# Patient Record
Sex: Female | Born: 1980
Health system: Southern US, Community
[De-identification: ages and names within clinical notes are randomized; demographics above are authoritative.]

## PROBLEM LIST (undated history)

## (undated) ENCOUNTER — Inpatient Hospital Stay (HOSPITAL_COMMUNITY): Payer: Self-pay

## (undated) DIAGNOSIS — F419 Anxiety disorder, unspecified: Secondary | ICD-10-CM

## (undated) DIAGNOSIS — J45909 Unspecified asthma, uncomplicated: Secondary | ICD-10-CM

## (undated) DIAGNOSIS — F329 Major depressive disorder, single episode, unspecified: Secondary | ICD-10-CM

## (undated) DIAGNOSIS — N390 Urinary tract infection, site not specified: Secondary | ICD-10-CM

## (undated) DIAGNOSIS — G35 Multiple sclerosis: Secondary | ICD-10-CM

## (undated) DIAGNOSIS — N3010 Interstitial cystitis (chronic) without hematuria: Secondary | ICD-10-CM

## (undated) DIAGNOSIS — D649 Anemia, unspecified: Secondary | ICD-10-CM

## (undated) DIAGNOSIS — O99013 Anemia complicating pregnancy, third trimester: Secondary | ICD-10-CM

## (undated) DIAGNOSIS — D509 Iron deficiency anemia, unspecified: Secondary | ICD-10-CM

## (undated) DIAGNOSIS — F32A Depression, unspecified: Secondary | ICD-10-CM

## (undated) HISTORY — DX: Interstitial cystitis (chronic) without hematuria: N30.10

## (undated) HISTORY — DX: Anxiety disorder, unspecified: F41.9

## (undated) HISTORY — DX: Anemia, unspecified: D64.9

## (undated) HISTORY — DX: Urinary tract infection, site not specified: N39.0

## (undated) HISTORY — DX: Depression, unspecified: F32.A

## (undated) HISTORY — DX: Major depressive disorder, single episode, unspecified: F32.9

## (undated) HISTORY — DX: Unspecified asthma, uncomplicated: J45.909

## (undated) HISTORY — PX: COLONOSCOPY: SHX174

## (undated) HISTORY — PX: OTHER SURGICAL HISTORY: SHX169

---
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
## 2002-04-25 ENCOUNTER — Other Ambulatory Visit: Admission: RE | Admit: 2002-04-25 | Discharge: 2002-04-25 | Payer: Self-pay | Admitting: Gynecology

## 2003-03-16 ENCOUNTER — Ambulatory Visit (HOSPITAL_BASED_OUTPATIENT_CLINIC_OR_DEPARTMENT_OTHER): Admission: RE | Admit: 2003-03-16 | Discharge: 2003-03-16 | Payer: Self-pay | Admitting: Urology

## 2003-03-16 ENCOUNTER — Ambulatory Visit (HOSPITAL_COMMUNITY): Admission: RE | Admit: 2003-03-16 | Discharge: 2003-03-16 | Payer: Self-pay | Admitting: Urology

## 2003-03-16 ENCOUNTER — Encounter (INDEPENDENT_AMBULATORY_CARE_PROVIDER_SITE_OTHER): Payer: Self-pay | Admitting: Specialist

## 2003-04-23 ENCOUNTER — Emergency Department (HOSPITAL_COMMUNITY): Admission: EM | Admit: 2003-04-23 | Discharge: 2003-04-23 | Payer: Self-pay | Admitting: Emergency Medicine

## 2004-03-26 ENCOUNTER — Other Ambulatory Visit: Admission: RE | Admit: 2004-03-26 | Discharge: 2004-03-26 | Payer: Self-pay | Admitting: Gynecology

## 2004-07-28 ENCOUNTER — Other Ambulatory Visit: Admission: RE | Admit: 2004-07-28 | Discharge: 2004-07-28 | Payer: Self-pay | Admitting: Gynecology

## 2005-04-27 ENCOUNTER — Other Ambulatory Visit: Admission: RE | Admit: 2005-04-27 | Discharge: 2005-04-27 | Payer: Self-pay | Admitting: Gynecology

## 2006-05-19 ENCOUNTER — Other Ambulatory Visit: Admission: RE | Admit: 2006-05-19 | Discharge: 2006-05-19 | Payer: Self-pay | Admitting: Gynecology

## 2008-08-16 ENCOUNTER — Other Ambulatory Visit: Admission: RE | Admit: 2008-08-16 | Discharge: 2008-08-16 | Payer: Self-pay | Admitting: Family Medicine

## 2010-01-02 ENCOUNTER — Other Ambulatory Visit: Admission: RE | Admit: 2010-01-02 | Discharge: 2010-01-02 | Payer: Self-pay | Admitting: Family Medicine

## 2010-07-04 NOTE — Op Note (Signed)
[name]  Patient:    Crystal Wilkerson, Crystal Wilkerson                       MRN: [id number] Proc. Date: 01/23/00 Adm. Date:  [id number] Attending:  Loura Halt Ii                           Operative Report  PREOPERATIVE DIAGNOSIS:  A 1.5 cm presternal keloid.  POSTOPERATIVE DIAGNOSIS:  A 1.5 cm presternal keloid.  PROCEDURE:  Excision of keloid with primary closure.  SURGEON:  Alfredia Ferguson, M.D.  ANESTHESIA:  2% Xylocaine and 1:100,000 epinephrine.  INDICATION FOR SURGERY:  This is a 30 year old female with a very prominent keloid in her mid-presternal area.  She has been treated with a number of modalities, including injection and laser, without improvement.  The patient comes in today for excision of the keloid.  She understands there is a high risk of recurrence of the keloid.  In spite of that, she wishes to proceed.  DESCRIPTION OF PROCEDURE:  Local anesthesia was infiltrated in the area of the keloid, and the area was prepped and draped in a sterile fashion.  After waiting approximately 10 minutes, an elliptical excision of the keloid was carried out down to the level of the subcutaneous tissue.  The keloid was passed off for pathology.  Wound edges were undermined for a distance of 3-4 mm.  Several drops of Kenalog 40 were placed in the wound.  They were allowed to just sit prior to closure.  The wound was closed by approximating the subdermal layer using interrupted 5-0 Vicryl suture.  The skin edges were united using a running 4-0 Prolene subcuticular.  Dermabond was applied, followed by Steri-Strips.  A light dressing was applied and the patient discharged to home in satisfactory condition. DD:  01/23/00 TD:  01/23/00 Job: [id number]

## 2010-07-04 NOTE — Op Note (Signed)
NAME:  Crystal Wilkerson, Crystal Wilkerson                          ACCOUNT NO.:  [id number]   MEDICAL RECORD NO.:  [id number]                   PATIENT TYPE:  AMB   LOCATION:  [facility]                                 FACILITY:  [facility]   PHYSICIAN:  Jamison Neighbor, M.D.               DATE OF BIRTH:  May 29, 1980   DATE OF PROCEDURE:  03/16/2003  DATE OF DISCHARGE:                                 OPERATIVE REPORT   PREOPERATIVE DIAGNOSIS:  Interstitial cystitis.   POSTOPERATIVE DIAGNOSIS:  Interstitial cystitis.   PROCEDURES:  1. Cystoscopy.  2. Urethral calibration.  3. Hydrodistention of the bladder.  4. Bladder biopsy.  5. Marcaine and Pyridium instillation.  6. Marcaine and Kenalog injection.   SURGEON:  Jamison Neighbor, M.D.   ANESTHESIA:  General.   COMPLICATIONS:  None.   DRAINS:  None.   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  The patient began to have lower urinary tract urgency and  frequency and was evaluated by a urologist in [place].  The patient  underwent potassium testing and was told she had interstitial cystitis.  The  patient's symptoms have not been quite as severe as is seen in most IC  patients, but she is bothered by them and she wanted to see if something  could be done to help her situation improve faster.  The patient was not  interested in instillation therapy but did feel that a diagnostic and  therapeutic hydrodistention might be worthwhile.  She is aware of the fact  that there is no guarantee this will cut down on her symptoms and that she  certainly will have a flare-up of her symptoms in the short term.  The  patient does understand, however, that there is a 60% chance that she will  feel significantly better within one to two weeks of the procedure.  If she  does respond in a positive fashion, however, there is no guarantee that this  will last long-term.  The patient gave full and informed consent.   After successful  induction of general anesthesia, the patient was placed in  the dorsal lithotomy position and prepped with Betadine and draped in the  usual sterile fashion.  Careful bimanual examination revealed no  abnormalities of the urethra.  There was no sign of a diverticulum.  She had  no cystocele, rectocele, or enterocele.  There were no masses on bimanual  exam, and the uterus was palpably normal.  The urethra was calibrated to 81  [place] with female urethral sounds with no evidence of stenosis or  stricture.  The cystoscope was inserted.  The bladder was carefully  inspected.  It was free of any tumor or stones.  Both ureteral orifices were  normal in configuration and location.  The bladder was distended at a  pressure of 100 cmH2O for five minutes.  When the bladder was drained,  the  patient was found to have a bladder capacity of just over 1200 mL, which is  essentially normal.  The patient did not have glomerulations.  A biopsy was  taken and the biopsy site was cauterized.  The biopsy was sent for mast cell  analysis.  The patient's bladder was drained and a mixture of Marcaine and  Pyridium was left in the bladder.  Marcaine and Kenalog were injected  periurethrally.  The patient tolerated the procedure well and was taken to  the recovery room in good condition.                                               Jamison Neighbor, M.D.    RJE/MEDQ  D:  03/16/2003  T:  03/16/2003  Job:  [id number]

## 2011-01-12 ENCOUNTER — Other Ambulatory Visit: Payer: Self-pay | Admitting: Family Medicine

## 2011-01-12 ENCOUNTER — Other Ambulatory Visit (HOSPITAL_COMMUNITY)
Admission: RE | Admit: 2011-01-12 | Discharge: 2011-01-12 | Disposition: A | Discharge status: Home / Self Care | Payer: BC Managed Care – PPO | Source: Ambulatory Visit | Attending: Family Medicine | Admitting: Family Medicine

## 2011-01-12 DIAGNOSIS — Z124 Encounter for screening for malignant neoplasm of cervix: Secondary | ICD-10-CM | POA: Insufficient documentation

## 2011-01-12 DIAGNOSIS — Z113 Encounter for screening for infections with a predominantly sexual mode of transmission: Secondary | ICD-10-CM | POA: Insufficient documentation

## 2011-07-24 ENCOUNTER — Encounter: Payer: Self-pay | Admitting: Gastroenterology

## 2011-08-31 ENCOUNTER — Other Ambulatory Visit (INDEPENDENT_AMBULATORY_CARE_PROVIDER_SITE_OTHER): Payer: BC Managed Care – PPO

## 2011-08-31 ENCOUNTER — Encounter: Payer: Self-pay | Admitting: Gastroenterology

## 2011-08-31 ENCOUNTER — Ambulatory Visit (INDEPENDENT_AMBULATORY_CARE_PROVIDER_SITE_OTHER): Payer: BC Managed Care – PPO | Admitting: Gastroenterology

## 2011-08-31 VITALS — BP 122/70 | HR 75 | Ht 70.0 in | Wt 145.2 lb

## 2011-08-31 DIAGNOSIS — R197 Diarrhea, unspecified: Secondary | ICD-10-CM

## 2011-08-31 DIAGNOSIS — R112 Nausea with vomiting, unspecified: Secondary | ICD-10-CM

## 2011-08-31 DIAGNOSIS — R109 Unspecified abdominal pain: Secondary | ICD-10-CM

## 2011-08-31 LAB — BASIC METABOLIC PANEL
BUN: 12 mg/dL (ref 6–23)
Calcium: 9.2 mg/dL (ref 8.4–10.5)
Chloride: 102 mEq/L (ref 96–112)
Creatinine, Ser: 0.9 mg/dL (ref 0.4–1.2)
GFR: 77.37 mL/min (ref >60.00)
Glucose, Bld: 85 mg/dL (ref 70–99)
Potassium: 3.5 mEq/L (ref 3.5–5.1)

## 2011-08-31 LAB — CBC WITH DIFFERENTIAL/PLATELET
Basophils Absolute: 0 10*3/uL (ref 0.0–0.1)
Basophils Relative: 0.4 % (ref 0.0–3.0)
Eosinophils Relative: 3 % (ref 0.0–5.0)
HCT: 36 % (ref 36.0–46.0)
Lymphocytes Relative: 30.7 % (ref 12.0–46.0)
Lymphs Abs: 2.2 10*3/uL (ref 0.7–4.0)
MCV: 89.2 fl (ref 78.0–100.0)
Monocytes Relative: 7.9 % (ref 3.0–12.0)
Neutro Abs: 4.2 10*3/uL (ref 1.4–7.7)
Neutrophils Relative %: 58 % (ref 43.0–77.0)
Platelets: 273 10*3/uL (ref 150.0–400.0)

## 2011-08-31 LAB — HEPATIC FUNCTION PANEL
ALT: 18 U/L (ref 0–35)
Bilirubin, Direct: 0.1 mg/dL (ref 0.0–0.3)
Total Bilirubin: 1.1 mg/dL (ref 0.3–1.2)
Total Protein: 7.3 g/dL (ref 6.0–8.3)

## 2011-08-31 MED ORDER — GLYCOPYRROLATE 1 MG PO TABS: 1.0000 mg | ORAL_TABLET | Freq: Two times a day (BID) | ORAL | Status: DC

## 2011-08-31 NOTE — Patient Instructions (Signed)
Your physician has requested that you go to the basement for the following lab work before leaving today: We have sent the following medications to your pharmacy for you to pick up at your convenience:Robinul. You have been given a Fobmap diet. Follow instructions on Hemoccult cards and mail them back to [place] when finished.

## 2011-08-31 NOTE — Progress Notes (Signed)
History of Present Illness: This is a 31 year old female who relates a history of frequent crampy abdominal pain associated with urgent watery nonbloody diarrhea since she was a child. She's tried eliminating various foods including milk products and processed foods with no change in symptoms. Sometimes her symptoms are worsened with stress. She has rare episodes of nausea and vomiting that she feels are related to stress. She states her primary physician obtain a celiac panel several months ago that was negative. She is not previously had evaluation by a gastroenterologist. She has a history of interstitial cystitis and has frequent urination for many years. She was evaluated by Dr. Logan Bores at age 10. Denies weight loss, constipation, change in stool caliber, melena, hematochezia, nausea, vomiting, dysphagia, reflux symptoms, chest pain.  Review of Systems: Pertinent positive and negative review of systems were noted in the above HPI section. All other review of systems were otherwise negative.  Current Medications, Allergies, Past Medical History, Past Surgical History, Family History and Social History were reviewed in [employer] record.  Physical Exam: General: Well developed , well nourished, no acute distress Head: Normocephalic and atraumatic Eyes:  sclerae anicteric, EOMI Ears: Normal auditory acuity Mouth: No deformity or lesions Neck: Supple, no masses or thyromegaly Lungs: Clear throughout to auscultation Heart: Regular rate and rhythm; no murmurs, rubs or bruits Abdomen: Soft, non tender and non distended. No masses, hepatosplenomegaly or hernias noted. Normal Bowel sounds Musculoskeletal: Symmetrical with no gross deformities  Skin: No lesions on visible extremities Pulses:  Normal pulses noted Extremities: No clubbing, cyanosis, edema or deformities noted Neurological: Alert oriented x 4, grossly nonfocal Cervical Nodes:  No significant cervical  adenopathy Inguinal Nodes: No significant inguinal adenopathy Psychological:  Alert and cooperative. Normal mood and affect  Assessment and Recommendations:  1. Urgent postprandial diarrhea and abdominal pain for 20 years. I suspect she has irritable bowel syndrome. Obtain standard stool studies, blood work and stool Hemoccults. Trial of FODMAP diet and glycopyrrolate twice a day. Request records of blood work from her primary physician including her celiac panel. Consider colonoscopy to exclude inflammatory bowel disease and microscopic colitis.  2. Episodic nausea and vomiting. Possibly IBS or stress induced. Observe response to treatment for irritable bowel syndrome.

## 2011-09-07 ENCOUNTER — Other Ambulatory Visit: Payer: BC Managed Care – PPO

## 2011-09-07 DIAGNOSIS — R109 Unspecified abdominal pain: Secondary | ICD-10-CM

## 2011-09-07 DIAGNOSIS — R197 Diarrhea, unspecified: Secondary | ICD-10-CM

## 2011-09-08 LAB — OVA AND PARASITE SCREEN: OP: NONE SEEN

## 2011-09-08 LAB — FECAL LACTOFERRIN, QUANT: Lactoferrin: NEGATIVE

## 2011-09-08 LAB — GIARDIA ANTIGEN: Giardia Screen (EIA): NEGATIVE

## 2011-09-11 ENCOUNTER — Telehealth: Payer: Self-pay | Admitting: *Deleted

## 2011-09-11 LAB — STOOL CULTURE

## 2011-09-11 NOTE — Telephone Encounter (Signed)
Informed pt the stool culture was negative, but the Pancreatic Elastase is still pending; sometimes it takes > 2 weeks for a result. Pt stated understanding and feels better with the medication, but has a dry mouth. She asked what to do about a f/u and I informed her Dr Russella Dar will instruct [place] when he gets all labs back. Pt stated understanding.

## 2011-09-29 ENCOUNTER — Telehealth: Payer: Self-pay | Admitting: Gastroenterology

## 2011-09-29 NOTE — Telephone Encounter (Signed)
Patient did not tolerate glycopyrolate well.  Made her dizzy and gave her dry mouth, so she stopped taking it.  She wants to know what the next step.  She was advised that you recommended colonoscopy, but she wants to make sure that is your current recommendation.  She is aware you are out of town.  Dr. Russella Dar please advise if it is ok to set up colon or office visit?

## 2011-10-01 NOTE — Telephone Encounter (Signed)
Direct colonoscopy Can try Levsin 1-2 ac and q4h prn

## 2011-10-02 MED ORDER — HYOSCYAMINE SULFATE 0.125 MG SL SUBL: 0.1250 mg | SUBLINGUAL_TABLET | SUBLINGUAL | Status: DC | PRN

## 2011-10-02 NOTE — Telephone Encounter (Signed)
Patient advised.  I have scheduled her for 11/03/11 2:00 colonoscopy and 10/27/11 for pre-visit.  She will call back for any questions

## 2011-10-27 ENCOUNTER — Ambulatory Visit (AMBULATORY_SURGERY_CENTER): Payer: BC Managed Care – PPO | Admitting: *Deleted

## 2011-10-27 ENCOUNTER — Encounter: Payer: Self-pay | Admitting: Gastroenterology

## 2011-10-27 ENCOUNTER — Telehealth: Payer: Self-pay | Admitting: *Deleted

## 2011-10-27 VITALS — Ht 70.0 in | Wt 147.0 lb

## 2011-10-27 DIAGNOSIS — Z1211 Encounter for screening for malignant neoplasm of colon: Secondary | ICD-10-CM

## 2011-10-27 DIAGNOSIS — R197 Diarrhea, unspecified: Secondary | ICD-10-CM

## 2011-10-27 DIAGNOSIS — R109 Unspecified abdominal pain: Secondary | ICD-10-CM

## 2011-10-27 MED ORDER — MOVIPREP 100 G PO SOLR: ORAL | Status: DC

## 2011-10-27 NOTE — Telephone Encounter (Signed)
Crystal Wilkerson verbalized disappointment in the communication between staff and her re: info on labs performed, their results, what the doctor is checking for and needing to know if her insurance will pay for the colonoscopy.  She states she feels like she's not important as a patient and just one of thousands.  I assured her this was not so and gave her info on getting help financially and Morrie Sheldon gave her the information re: her insurance.  I also pointed out the Grievance numbers should she want to go that route.  And that she can get a copy of her labs at the medical records dept.  I offered to have Darleen Crocker to come talk with her but she had to return to work and said she would call and leave a message for Aram Beecham to call her

## 2011-10-27 NOTE — Progress Notes (Signed)
Ms. Arduini verbalized disappointment in the communication between staff and her re: info on labs performed, their results, what the doctor is checking for and needing to know if her insurance will pay for the colonoscopy.  She states she feels like she's not important as a patient and just one of thousands.  I assured her this was not so and gave her info on getting help financially and Ashley gave her the information re: her insurance.  I also pointed out the Grievance numbers should she want to go that route.  And that she can get a copy of her labs at the medical records dept.  I offered to have Cynthia RN to come talk with her but she had to return to work and said she would call and leave a message for Cynthia to call her 

## 2011-10-28 ENCOUNTER — Telehealth: Payer: Self-pay | Admitting: Gastroenterology

## 2011-10-28 NOTE — Telephone Encounter (Signed)
Her insurance requires she meet her deductible then only covers her colonoscopy at 50%.  She can't afford to have a colonoscopy.  She would like answers to what is wrong with her.  She is asked to schedule an office visit to come back and discuss.  "I am not sure what I would be discussing".  She wants to know where the pain is coming from and not just take medicine.  She declines an appt and then hung up.

## 2011-10-28 NOTE — Telephone Encounter (Signed)
I have left a detailed message for the patient

## 2011-10-28 NOTE — Telephone Encounter (Signed)
Colonoscopy is the next step in her evaluation. I think she has IBS but need to exclude IBD and other types of colitis. Continue hyoscyamine 1-2 q4h as needed.

## 2011-11-03 ENCOUNTER — Encounter: Payer: BC Managed Care – PPO | Admitting: Gastroenterology

## 2012-04-04 ENCOUNTER — Other Ambulatory Visit (HOSPITAL_COMMUNITY)
Admission: RE | Admit: 2012-04-04 | Discharge: 2012-04-04 | Disposition: A | Discharge status: Home / Self Care | Payer: BC Managed Care – PPO | Source: Ambulatory Visit | Attending: Family Medicine | Admitting: Family Medicine

## 2012-04-04 ENCOUNTER — Other Ambulatory Visit: Payer: Self-pay | Admitting: Family Medicine

## 2012-04-04 DIAGNOSIS — Z124 Encounter for screening for malignant neoplasm of cervix: Secondary | ICD-10-CM | POA: Insufficient documentation

## 2012-04-04 DIAGNOSIS — Z1151 Encounter for screening for human papillomavirus (HPV): Secondary | ICD-10-CM | POA: Insufficient documentation

## 2012-04-04 DIAGNOSIS — Z113 Encounter for screening for infections with a predominantly sexual mode of transmission: Secondary | ICD-10-CM | POA: Insufficient documentation

## 2013-05-01 ENCOUNTER — Other Ambulatory Visit (HOSPITAL_COMMUNITY)
Admission: RE | Admit: 2013-05-01 | Discharge: 2013-05-01 | Disposition: A | Discharge status: Home / Self Care | Payer: BC Managed Care – PPO | Source: Ambulatory Visit | Attending: Family Medicine | Admitting: Family Medicine

## 2013-05-01 ENCOUNTER — Other Ambulatory Visit: Payer: Self-pay | Admitting: Family Medicine

## 2013-05-01 DIAGNOSIS — Z113 Encounter for screening for infections with a predominantly sexual mode of transmission: Secondary | ICD-10-CM | POA: Insufficient documentation

## 2013-05-01 DIAGNOSIS — N760 Acute vaginitis: Secondary | ICD-10-CM | POA: Insufficient documentation

## 2013-05-01 DIAGNOSIS — Z124 Encounter for screening for malignant neoplasm of cervix: Secondary | ICD-10-CM | POA: Insufficient documentation

## 2014-01-01 ENCOUNTER — Emergency Department (HOSPITAL_COMMUNITY): Payer: BC Managed Care – PPO

## 2014-01-01 ENCOUNTER — Encounter (HOSPITAL_COMMUNITY): Payer: Self-pay | Admitting: Family Medicine

## 2014-01-01 ENCOUNTER — Emergency Department (HOSPITAL_COMMUNITY)
Admission: EM | Admit: 2014-01-01 | Discharge: 2014-01-01 | Disposition: A | Discharge status: Home / Self Care | Payer: BC Managed Care – PPO | Attending: Emergency Medicine | Admitting: Emergency Medicine

## 2014-01-01 DIAGNOSIS — Y9289 Other specified places as the place of occurrence of the external cause: Secondary | ICD-10-CM | POA: Diagnosis not present

## 2014-01-01 DIAGNOSIS — J45909 Unspecified asthma, uncomplicated: Secondary | ICD-10-CM | POA: Insufficient documentation

## 2014-01-01 DIAGNOSIS — Z862 Personal history of diseases of the blood and blood-forming organs and certain disorders involving the immune mechanism: Secondary | ICD-10-CM | POA: Insufficient documentation

## 2014-01-01 DIAGNOSIS — Z23 Encounter for immunization: Secondary | ICD-10-CM | POA: Diagnosis not present

## 2014-01-01 DIAGNOSIS — Z8659 Personal history of other mental and behavioral disorders: Secondary | ICD-10-CM | POA: Insufficient documentation

## 2014-01-01 DIAGNOSIS — Y9389 Activity, other specified: Secondary | ICD-10-CM | POA: Diagnosis not present

## 2014-01-01 DIAGNOSIS — S61216A Laceration without foreign body of right little finger without damage to nail, initial encounter: Secondary | ICD-10-CM | POA: Insufficient documentation

## 2014-01-01 DIAGNOSIS — R52 Pain, unspecified: Secondary | ICD-10-CM

## 2014-01-01 DIAGNOSIS — S51831A Puncture wound without foreign body of right forearm, initial encounter: Secondary | ICD-10-CM | POA: Insufficient documentation

## 2014-01-01 DIAGNOSIS — Z79899 Other long term (current) drug therapy: Secondary | ICD-10-CM | POA: Insufficient documentation

## 2014-01-01 DIAGNOSIS — S41151A Open bite of right upper arm, initial encounter: Secondary | ICD-10-CM

## 2014-01-01 DIAGNOSIS — W540XXA Bitten by dog, initial encounter: Secondary | ICD-10-CM | POA: Insufficient documentation

## 2014-01-01 DIAGNOSIS — Y998 Other external cause status: Secondary | ICD-10-CM | POA: Diagnosis not present

## 2014-01-01 DIAGNOSIS — Z793 Long term (current) use of hormonal contraceptives: Secondary | ICD-10-CM | POA: Insufficient documentation

## 2014-01-01 DIAGNOSIS — Z8742 Personal history of other diseases of the female genital tract: Secondary | ICD-10-CM | POA: Insufficient documentation

## 2014-01-01 DIAGNOSIS — Z87440 Personal history of urinary (tract) infections: Secondary | ICD-10-CM | POA: Insufficient documentation

## 2014-01-01 DIAGNOSIS — S61431A Puncture wound without foreign body of right hand, initial encounter: Secondary | ICD-10-CM | POA: Insufficient documentation

## 2014-01-01 MED ORDER — AMOXICILLIN-POT CLAVULANATE 875-125 MG PO TABS: 1.0000 | ORAL_TABLET | Freq: Two times a day (BID) | ORAL | Status: DC

## 2014-01-01 MED ORDER — TETANUS-DIPHTH-ACELL PERTUSSIS 5-2.5-18.5 LF-MCG/0.5 IM SUSP
0.5000 mL | Freq: Once | INTRAMUSCULAR | Status: AC
  Administered 2014-01-01: 0.5 mL via INTRAMUSCULAR
  Filled 2014-01-01: qty 0.5

## 2014-01-01 MED ORDER — HYDROCODONE-ACETAMINOPHEN 5-325 MG PO TABS: 2.0000 | ORAL_TABLET | ORAL | Status: DC | PRN

## 2014-01-01 NOTE — Discharge Instructions (Signed)
1. Medications: vicodin, augmentin, usual home medications 2. Treatment: rest, drink plenty of fluids, keep wounds clean with warm soap and water 3. Follow Up: Please followup with your primary doctor in 2 days for wound check; if you do not have a primary care doctor use the resource guide provided to find one; Please return to the ER for redness, fever, or purulent    Animal Bite An animal bite can result in a scratch on the skin, deep open cut, puncture of the skin, crush injury, or tearing away of the skin or a body part. Dogs are responsible for most animal bites. Children are bitten more often than adults. An animal bite can range from very mild to more serious. A small bite from your house pet is no cause for alarm. However, some animal bites can become infected or injure a bone or other tissue. You must seek medical care if:  The skin is broken and bleeding does not slow down or stop after 15 minutes.  The puncture is deep and difficult to clean (such as a cat bite).  Pain, warmth, redness, or pus develops around the wound.  The bite is from a stray animal or rodent. There may be a risk of rabies infection.  The bite is from a snake, raccoon, skunk, fox, coyote, or bat. There may be a risk of rabies infection.  The person bitten has a chronic illness such as diabetes, liver disease, or cancer, or the person takes medicine that lowers the immune system.  There is concern about the location and severity of the bite. It is important to clean and protect an animal bite wound right away to prevent infection. Follow these steps:  Clean the wound with plenty of water and soap.  Apply an antibiotic cream.  Apply gentle pressure over the wound with a clean towel or gauze to slow or stop bleeding.  Elevate the affected area above the heart to help stop any bleeding.  Seek medical care. Getting medical care within 8 hours of the animal bite leads to the best possible outcome. DIAGNOSIS    Your caregiver will most likely:  Take a detailed history of the animal and the bite injury.  Perform a wound exam.  Take your medical history. Blood tests or X-rays may be performed. Sometimes, infected bite wounds are cultured and sent to a lab to identify the infectious bacteria.  TREATMENT  Medical treatment will depend on the location and type of animal bite as well as the patient's medical history. Treatment may include:  Wound care, such as cleaning and flushing the wound with saline solution, bandaging, and elevating the affected area.  Antibiotics.  Tetanus immunization.  Rabies immunization.  Leaving the wound open to heal. This is often done with animal bites, due to the high risk of infection. However, in certain cases, wound closure with stitches, wound adhesive, skin adhesive strips, or staples may be used. Infected bites that are left untreated may require intravenous (IV) antibiotics and surgical treatment in the hospital. HOME CARE INSTRUCTIONS  Follow your caregiver's instructions for wound care.  Take all medicines as directed.  If your caregiver prescribes antibiotics, take them as directed. Finish them even if you start to feel better.  Follow up with your caregiver for further exams or immunizations as directed. You may need a tetanus shot if:  You cannot remember when you had your last tetanus shot.  You have never had a tetanus shot.  The injury broke your skin.  If you get a tetanus shot, your arm may swell, get red, and feel warm to the touch. This is common and not a problem. If you need a tetanus shot and you choose not to have one, there is a rare chance of getting tetanus. Sickness from tetanus can be serious. SEEK MEDICAL CARE IF:  You notice warmth, redness, soreness, swelling, pus discharge, or a bad smell coming from the wound.  You have a red line on the skin coming from the wound.  You have a fever, chills, or a general ill  feeling.  You have nausea or vomiting.  You have continued or worsening pain.  You have trouble moving the injured part.  You have other questions or concerns. MAKE SURE YOU:  Understand these instructions.  Will watch your condition.  Will get help right away if you are not doing well or get worse. Document Released: 10/21/2010 Document Revised: 04/27/2011 Document Reviewed: 10/21/2010 [facility] Patient Information 2015 [place], [place]. This information is not intended to replace advice given to you by your health care provider. Make sure you discuss any questions you have with your health care provider.
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
## 2014-01-01 NOTE — ED Provider Notes (Signed)
CSN: [id number]     Arrival date & time 01/01/14  1007 History  This chart was scribed for non-physician practitioner Dierdre Forth, PA-C, working with Linwood Dibbles, MD by Littie Deeds, ED Scribe. This patient was seen in room TR09C/TR09C and the patient's care was started at 11:43 AM.      Chief Complaint  Patient presents with  . Animal Bite     The history is provided by the patient. No language interpreter was used.   HPI Comments: Crystal Wilkerson is a 33 y.o. female who presents to the Emergency Department complaining of an dog bite on her right forearm and right hand that occurred this morning while trying to break up a fight between her 2 dogs. Patient has a numerous small lacerations and puncture wounds on her right forearm and hand; she notes having the largest amount of pain on her 3rd finger on her right hand without numbness. She states that her dogs are UTD on shots. NKDA. She denies having hx of DM, HIV and IVDA, and she has not had any steroids recently.  No aggravating or alleviating factors.  Pt washed the wounds at home PTA.  Pt denies fever, chills, numbness, weakness, N/V.    Past Medical History  Diagnosis Date  . Interstitial cystitis   . Anemia   . Anxiety   . Depression   . UTI (lower urinary tract infection)   . Asthma     mild on exertion   Past Surgical History  Procedure Laterality Date  . Bladder enlarged      2006   Family History  Problem Relation Age of Onset  . Breast cancer Paternal Grandmother   . Heart failure Paternal Grandmother   . Heart disease Maternal Grandfather     all grandparents  . Diabetes Maternal Grandmother    History  Substance Use Topics  . Smoking status: Never Smoker   . Smokeless tobacco: Never Used  . Alcohol Use: 0.6 oz/week    1 Cans of beer per week     Comment: occ.   OB History    No data available     Review of Systems  Constitutional: Negative for fever and chills.  Gastrointestinal: Negative for  nausea and vomiting.  Musculoskeletal: Positive for joint swelling and arthralgias. Negative for back pain, neck pain and neck stiffness.  Skin: Positive for wound.  Allergic/Immunologic: Negative for immunocompromised state.  Neurological: Negative for weakness and numbness.  Hematological: Does not bruise/bleed easily.  Psychiatric/Behavioral: The patient is not nervous/anxious.   All other systems reviewed and are negative.     Allergies  Review of patient's allergies indicates no known allergies.  Home Medications   Prior to Admission medications   Medication Sig Start Date End Date Taking? Authorizing Provider  amoxicillin-clavulanate (AUGMENTIN) 875-125 MG per tablet Take 1 tablet by mouth every 12 (twelve) hours. 01/01/14   Jaxie Racanelli, PA-C  HYDROcodone-acetaminophen (NORCO/VICODIN) 5-325 MG per tablet Take 2 tablets by mouth every 4 (four) hours as needed for moderate pain or severe pain. 01/01/14   Anasofia Micallef, PA-C  hyoscyamine (LEVSIN SL) 0.125 MG SL tablet Place 0.125 mg under the tongue every 4 (four) hours as needed. 10/02/11 11/26/11  Meryl Dare, MD  LYSINE ACETATE PO Take 1 tablet by mouth daily.    Historical Provider, MD  MOVIPREP 100 G SOLR Movi prep take as directed no substitution 10/27/11   Meryl Dare, MD  Norgestimate-Ethinyl Estradiol Triphasic (TRI-SPRINTEC) 0.18/0.215/0.25 MG-35 MCG tablet  Take 1 tablet by mouth daily.    Historical Provider, MD   BP 112/73 mmHg  Pulse 68  Temp(Src) 98.1 F (36.7 C)  Resp 15  Wt 155 lb (70.308 kg)  SpO2 97%  LMP 12/31/2013 Physical Exam  Constitutional: She is oriented to person, place, and time. She appears well-developed and well-nourished. No distress.  HENT:  Head: Normocephalic and atraumatic.  Eyes: Conjunctivae are normal. No scleral icterus.  Neck: Normal range of motion.  Cardiovascular: Normal rate, regular rhythm, normal heart sounds and intact distal pulses.   No murmur  heard. Capillary refill < 3 sec  Pulmonary/Chest: Effort normal and breath sounds normal. No respiratory distress.  Musculoskeletal: Normal range of motion. She exhibits no edema.  ROM: full ROM of all joints of the right upper extremity.  Neurological: She is alert and oriented to person, place, and time.  Sensation: intact to dull and sharp in all fingers of the right hand. Strength: 5/5 in the right upper extremity, including grip strength.  Skin: Skin is warm and dry. She is not diaphoretic.  Multiple puncture wounds to the right forearm. Puncture wound to the palm of hand to the base of right long finger, one at the dorsum of the hand along the right ring finger. Laceration to the lateral side of the right little finger.  Psychiatric: She has a normal mood and affect.  Nursing note and vitals reviewed.   ED Course  LACERATION REPAIR Date/Time: 01/01/2014 5:13 PM Performed by: Dierdre Forth Authorized by: Dierdre Forth Consent: Verbal consent obtained. Risks and benefits: risks, benefits and alternatives were discussed Consent given by: patient Patient understanding: patient states understanding of the procedure being performed Patient consent: the patient's understanding of the procedure matches consent given Procedure consent: procedure consent matches procedure scheduled Relevant documents: relevant documents present and verified Site marked: the operative site was marked Imaging studies: imaging studies available Required items: required blood products, implants, devices, and special equipment available Patient identity confirmed: verbally with patient and arm band Time out: Immediately prior to procedure a "time out" was called to verify the correct patient, procedure, equipment, support staff and site/side marked as required. Body area: upper extremity Location details: right lower arm Laceration length: 2 cm Foreign bodies: no foreign bodies Tendon  involvement: none Nerve involvement: none Vascular damage: no Patient sedated: no Irrigation solution: saline Irrigation method: syringe Amount of cleaning: extensive Skin closure: Steri-Strips Approximation: loose Dressing: 4x4 sterile gauze Patient tolerance: Patient tolerated the procedure well with no immediate complications  LACERATION REPAIR Date/Time: 01/01/2014 5:14 PM Performed by: Dierdre Forth Authorized by: Dierdre Forth Consent: Verbal consent obtained. Risks and benefits: risks, benefits and alternatives were discussed Consent given by: patient Patient understanding: patient states understanding of the procedure being performed Patient consent: the patient's understanding of the procedure matches consent given Procedure consent: procedure consent matches procedure scheduled Relevant documents: relevant documents present and verified Site marked: the operative site was marked Imaging studies: imaging studies available Required items: required blood products, implants, devices, and special equipment available Patient identity confirmed: verbally with patient Time out: Immediately prior to procedure a "time out" was called to verify the correct patient, procedure, equipment, support staff and site/side marked as required. Body area: upper extremity Location details: right lower arm Laceration length: 1.5 cm Tendon involvement: none Nerve involvement: none Vascular damage: no Patient sedated: no Irrigation solution: saline Irrigation method: syringe Amount of cleaning: extensive Skin closure: Steri-Strips Approximation: loose Dressing: 4x4 sterile gauze Patient tolerance:  Patient tolerated the procedure well with no immediate complications  LACERATION REPAIR Date/Time: 01/01/2014 5:15 PM Performed by: Dierdre Forth Authorized by: Dierdre Forth Consent: Verbal consent obtained. Risks and benefits: risks, benefits and alternatives  were discussed Consent given by: patient Patient understanding: patient states understanding of the procedure being performed Patient consent: the patient's understanding of the procedure matches consent given Procedure consent: procedure consent matches procedure scheduled Relevant documents: relevant documents present and verified Site marked: the operative site was marked Imaging studies: imaging studies available Required items: required blood products, implants, devices, and special equipment available Patient identity confirmed: verbally with patient and arm band Time out: Immediately prior to procedure a "time out" was called to verify the correct patient, procedure, equipment, support staff and site/side marked as required. Body area: upper extremity Location details: right small finger Laceration length: 3.2 cm Foreign bodies: no foreign bodies Tendon involvement: none Nerve involvement: none Vascular damage: no Patient sedated: no Preparation: Patient was prepped and draped in the usual sterile fashion. Irrigation solution: saline Irrigation method: syringe Amount of cleaning: extensive Skin closure: Steri-Strips Approximation: loose Dressing: 4x4 sterile gauze Patient tolerance: Patient tolerated the procedure well with no immediate complications    DIAGNOSTIC STUDIES: Oxygen Saturation is 100% on room air, normal by my interpretation.    COORDINATION OF CARE: 11:46 AM-Discussed treatment plan which includes abx and wound cleaning with pt at bedside and pt agreed to plan.    Labs Review Labs Reviewed - No data to display  Imaging Review Dg Forearm Right  01/01/2014   CLINICAL DATA:  Dog bite on right forearm.  EXAM: RIGHT FOREARM - 2 VIEW  COMPARISON:  None  FINDINGS: There is no evidence of fracture or other focal bone lesions. Gas identified within the volar soft tissues of the forearm.  IMPRESSION: 1. Soft tissue lacerations along the volar aspect of the  forearm. 2. No acute bone abnormality.  No foreign bodies identified.   Electronically Signed   By: Signa Kell M.D.   On: 01/01/2014 11:51   Dg Hand Complete Right  01/01/2014   CLINICAL DATA:  33 year old female status post dog bite with lacerations and pain. Initial encounter.  EXAM: RIGHT HAND - COMPLETE 3+ VIEW  COMPARISON:  None.  FINDINGS: There is a small volume of subcutaneous gas tracking between the second and third proximal phalanges and metacarpals (arrows). No radiopaque foreign body identified.  Bone mineralization is within normal limits. Distal radius and ulna appear intact. Carpal bone alignment within normal limits. Metacarpals and phalanges intact. Other joint spaces preserved.  IMPRESSION: Positive for subcutaneous gas between the second and third proximal phalanges and metacarpals. No acute fracture or dislocation identified about the right hand.   Electronically Signed   By: Augusto Gamble M.D.   On: 01/01/2014 11:52     EKG Interpretation None      MDM   Final diagnoses:  Pain  Dog bite of right arm, initial encounter   Crystal Wilkerson presents with multiple small lacerations and puncture wounds from a dog bite located on the right hand and forearm.   Pt wounds irrigated well with 18ga angiocath with sterile saline in addition to cleaning with a surgical scrub.  Wounds examined with visualization of the base and no foreign bodies seen.  Steri-Strips placed on the largest wounds to prevent gaping but with only loose approximation. No stitches placed due to significant risk for infection.  Pt Alert and oriented, NAD, nontoxic, nonseptic appearing.  Capillary refill  intact and pt without neurologic deficit. X-rays without acute fracture. X-ray of the right hand with gas within the soft tissue from laceration and puncture wound. Patient tetanus updated.  Rabies vaccine and immunoglobulin not given as pt knows the dogs and they are UTD. Pt declined pain control in the ED.  We'll  discharge home with pain medication, Augmentin and requests for close follow-up with PCP or back in the ER.   Patient is to have a wound check in 24-48 hours by her primary care physician. She's return to the emergency department for evidence of infection. Patient given follow-up with hand surgery as needed for potential of infection.  Discussed with patient my concerns about infection from a puncture wound, specifically from a dog bite.  We reviewed signs and symptoms of concerning infection several times. Patient tetanus updated.  I have personally reviewed patient's vitals, nursing note and any pertinent labs or imaging.  It has been determined that no acute conditions requiring further emergency intervention are present at this time. The patient/guardian have been advised of the diagnosis and plan. I reviewed all labs and imaging including any potential incidental findings. We have discussed signs and symptoms that warrant return to the ED and they are listed in the discharge instructions.    Vital signs are stable at discharge.   BP 112/73 mmHg  Pulse 68  Temp(Src) 98.1 F (36.7 C)  Resp 15  Wt 155 lb (70.308 kg)  SpO2 97%  LMP 12/31/2013  I personally performed the services described in this documentation, which was scribed in my presence. The recorded information has been reviewed and is accurate.     Dahlia ClientHannah Travez Stancil, PA-C 01/01/14 1724  Linwood DibblesJon Knapp, MD 01/02/14 [phone]

## 2015-02-17 NOTE — L&D Delivery Note (Addendum)
Delivery Note  First Stage: Labor onset: 1500 on 11/9 Augmentation : Pitocin Analgesia /Anesthesia intrapartum: water immersion x 15 mins then immediately to Epidural AROM at 2100  Second Stage: Complete dilation at 2100 Onset of pushing at 2300 FHR second stage 145  Delivery of a viable female at 7 by CNM in OA position Tight nuchal cord x 1 / unable to reduce over head / reduced after delivery of shoulders Cord double clamped after cessation of pulsation, cut by FOB - Nathan Cord blood sample collected   Third Stage: Placenta delivered via Tomasa Blase intact with 3 VC @ 0320 Placenta disposition: with patient for encapsualtion Uterine tone firm / bleeding minimal  Bilateral labia minora lacerations identified - hemostatic no repair needed at this time Est. Blood Loss (mL): 50  Complications: Marginal Cord Insertion (not previously dx'd), Nuchal Cord  Mom to postpartum.  Baby to Couplet care / Skin to Skin.  Newborn: Birth Weight: 7 lbs 8 oz  Apgar Scores: 7/9 Feeding planned: breast  Raelyn Mora, M  MSN, CNM 12/27/2015, 4:10 AM

## 2015-05-15 DIAGNOSIS — Z22330 Carrier of Group B streptococcus: Secondary | ICD-10-CM | POA: Diagnosis not present

## 2015-05-15 DIAGNOSIS — Z3402 Encounter for supervision of normal first pregnancy, second trimester: Secondary | ICD-10-CM | POA: Diagnosis not present

## 2015-05-15 DIAGNOSIS — Z3A20 20 weeks gestation of pregnancy: Secondary | ICD-10-CM | POA: Diagnosis not present

## 2015-05-15 DIAGNOSIS — N898 Other specified noninflammatory disorders of vagina: Secondary | ICD-10-CM | POA: Diagnosis not present

## 2015-05-20 DIAGNOSIS — N898 Other specified noninflammatory disorders of vagina: Secondary | ICD-10-CM | POA: Diagnosis not present

## 2015-05-21 DIAGNOSIS — G959 Disease of spinal cord, unspecified: Secondary | ICD-10-CM | POA: Diagnosis not present

## 2015-05-21 DIAGNOSIS — N319 Neuromuscular dysfunction of bladder, unspecified: Secondary | ICD-10-CM | POA: Diagnosis not present

## 2015-06-18 DIAGNOSIS — Z22330 Carrier of Group B streptococcus: Secondary | ICD-10-CM | POA: Diagnosis not present

## 2015-06-18 DIAGNOSIS — Z139 Encounter for screening, unspecified: Secondary | ICD-10-CM | POA: Diagnosis not present

## 2015-06-18 DIAGNOSIS — R5383 Other fatigue: Secondary | ICD-10-CM | POA: Diagnosis not present

## 2015-07-16 DIAGNOSIS — M47814 Spondylosis without myelopathy or radiculopathy, thoracic region: Secondary | ICD-10-CM | POA: Diagnosis not present

## 2015-07-16 DIAGNOSIS — M47812 Spondylosis without myelopathy or radiculopathy, cervical region: Secondary | ICD-10-CM | POA: Diagnosis not present

## 2015-07-16 DIAGNOSIS — R292 Abnormal reflex: Secondary | ICD-10-CM | POA: Diagnosis not present

## 2015-08-08 DIAGNOSIS — Z3402 Encounter for supervision of normal first pregnancy, second trimester: Secondary | ICD-10-CM | POA: Diagnosis not present

## 2015-08-08 DIAGNOSIS — Z22330 Carrier of Group B streptococcus: Secondary | ICD-10-CM | POA: Diagnosis not present

## 2015-08-08 DIAGNOSIS — Z36 Encounter for antenatal screening of mother: Secondary | ICD-10-CM | POA: Diagnosis not present

## 2015-08-08 DIAGNOSIS — Z3A20 20 weeks gestation of pregnancy: Secondary | ICD-10-CM | POA: Diagnosis not present

## 2015-08-23 ENCOUNTER — Encounter (HOSPITAL_COMMUNITY): Payer: Self-pay | Admitting: *Deleted

## 2015-08-23 ENCOUNTER — Inpatient Hospital Stay (HOSPITAL_COMMUNITY)
Admission: AD | Admit: 2015-08-23 | Discharge: 2015-08-23 | Disposition: A | Discharge status: Home / Self Care | Payer: BLUE CROSS/BLUE SHIELD | Source: Ambulatory Visit | Attending: Obstetrics & Gynecology | Admitting: Obstetrics & Gynecology

## 2015-08-23 DIAGNOSIS — D649 Anemia, unspecified: Secondary | ICD-10-CM | POA: Diagnosis not present

## 2015-08-23 DIAGNOSIS — O99342 Other mental disorders complicating pregnancy, second trimester: Secondary | ICD-10-CM | POA: Diagnosis not present

## 2015-08-23 DIAGNOSIS — O99512 Diseases of the respiratory system complicating pregnancy, second trimester: Secondary | ICD-10-CM | POA: Insufficient documentation

## 2015-08-23 DIAGNOSIS — F419 Anxiety disorder, unspecified: Secondary | ICD-10-CM | POA: Diagnosis not present

## 2015-08-23 DIAGNOSIS — Z3A22 22 weeks gestation of pregnancy: Secondary | ICD-10-CM | POA: Diagnosis not present

## 2015-08-23 DIAGNOSIS — O99012 Anemia complicating pregnancy, second trimester: Secondary | ICD-10-CM | POA: Insufficient documentation

## 2015-08-23 DIAGNOSIS — R0602 Shortness of breath: Secondary | ICD-10-CM | POA: Insufficient documentation

## 2015-08-23 LAB — CBC
HEMATOCRIT: 28.7 % — AB (ref 36.0–46.0)
HEMOGLOBIN: 9.9 g/dL — AB (ref 12.0–15.0)
MCH: 30.7 pg (ref 26.0–34.0)
MCHC: 34.5 g/dL (ref 30.0–36.0)
MCV: 88.9 fL (ref 78.0–100.0)
Platelets: 262 10*3/uL (ref 150–400)
RBC: 3.23 MIL/uL — ABNORMAL LOW (ref 3.87–5.11)
RDW: 13.7 % (ref 11.5–15.5)
WBC: 9.7 10*3/uL (ref 4.0–10.5)

## 2015-08-23 LAB — COMPREHENSIVE METABOLIC PANEL
ALK PHOS: 43 U/L (ref 38–126)
ALT: 19 U/L (ref 14–54)
AST: 19 U/L (ref 15–41)
Albumin: 3.4 g/dL — ABNORMAL LOW (ref 3.5–5.0)
Anion gap: 6 (ref 5–15)
BILIRUBIN TOTAL: 0.8 mg/dL (ref 0.3–1.2)
BUN: 7 mg/dL (ref 6–20)
CHLORIDE: 104 mmol/L (ref 101–111)
CO2: 22 mmol/L (ref 22–32)
Calcium: 8.7 mg/dL — ABNORMAL LOW (ref 8.9–10.3)
Creatinine, Ser: 0.54 mg/dL (ref 0.44–1.00)
GFR calc Af Amer: >60 mL/min (ref >60)
GFR calc non Af Amer: >60 mL/min (ref >60)
Glucose, Bld: 106 mg/dL — ABNORMAL HIGH (ref 65–99)
POTASSIUM: 3.4 mmol/L — AB (ref 3.5–5.1)
Sodium: 132 mmol/L — ABNORMAL LOW (ref 135–145)
TOTAL PROTEIN: 6.6 g/dL (ref 6.5–8.1)

## 2015-08-23 LAB — TSH: TSH: 1.698 u[IU]/mL (ref 0.350–4.500)

## 2015-08-23 MED ORDER — HYDROXYZINE PAMOATE 25 MG PO CAPS: 25.0000 mg | ORAL_CAPSULE | Freq: Three times a day (TID) | ORAL | Status: DC | PRN

## 2015-08-23 MED ORDER — ALBUTEROL SULFATE (2.5 MG/3ML) 0.083% IN NEBU
2.5000 mg | INHALATION_SOLUTION | Freq: Once | RESPIRATORY_TRACT | Status: AC
  Administered 2015-08-23: 2.5 mg via RESPIRATORY_TRACT
  Filled 2015-08-23: qty 3

## 2015-08-23 MED ORDER — FERROUS SULFATE 325 (65 FE) MG PO TABS: 325.0000 mg | ORAL_TABLET | Freq: Every day | ORAL | Status: DC

## 2015-08-23 MED ORDER — HYDROXYZINE HCL 50 MG/ML IM SOLN
25.0000 mg | Freq: Once | INTRAMUSCULAR | Status: AC
  Administered 2015-08-23: 25 mg via INTRAMUSCULAR
  Filled 2015-08-23: qty 0.5

## 2015-08-23 NOTE — MAU Note (Signed)
Patient presents to mau with c/o shortness of breath for the last few days. Sats 100% ra; HR 74.

## 2015-08-23 NOTE — MAU Provider Note (Signed)
History     CSN: [id number]  Arrival date and time: 08/23/15 1322   First Provider Initiated Contact with Patient 08/23/15 1355      Chief Complaint  Patient presents with  . Shortness of Breath   HPI   [name] is a 35 y.o. female G1P0 @ 31w5dpresenting with SOB that started last night around 5:30; when she got into bed around 8:30 pm the SOB was really bad. She had a hard time getting comfortable last night; she did wake up from the symptoms throughout the night, however was able to get some sleep. She got to work today and felt that the shortness of breath was still there and she feels that it is lasting longer. Nothing makes the symptoms  Better or worse. She had the same symptoms a few weeks ago, but they were short lived and went away quickly. She has never felt these symptoms outside of pregnancy. She has a lot going on in her life currently and is unsure whether the symptoms are related to stress.   The symptoms are making her anxious.   OB History    Gravida Para Term Preterm AB TAB SAB Ectopic Multiple Living   1               Past Medical History  Diagnosis Date  . Interstitial cystitis   . Anemia   . Anxiety   . Depression   . UTI (lower urinary tract infection)   . Asthma     mild on exertion    Past Surgical History  Procedure Laterality Date  . Bladder enlarged      2006    Family History  Problem Relation Age of Onset  . Breast cancer Paternal Grandmother   . Heart failure Paternal Grandmother   . Heart disease Maternal Grandfather     all grandparents  . Diabetes Maternal Grandmother     Social History  Substance Use Topics  . Smoking status: Never Smoker   . Smokeless tobacco: Never Used  . Alcohol Use: 0.6 oz/week    1 Cans of beer per week     Comment: occ.    Allergies: No Known Allergies  Prescriptions prior to admission  Medication Sig Dispense Refill Last Dose  . cyanocobalamin 100 MCG tablet Take 100 mcg by mouth  daily.   08/23/2015 at Unknown time  . folic acid (FOLVITE) 4268MCG tablet Take 400 mcg by mouth daily.   08/23/2015 at Unknown time  . LYSINE ACETATE PO Take 1 tablet by mouth daily.   08/23/2015 at Unknown time  . OVER THE COUNTER MEDICATION Take 2 tablets by mouth 3 (three) times daily. Pt takes a medication called Wholezymes. (For digestion)   08/21/2015  . Prenatal Vit-Fe Fumarate-FA (PRENATAL MULTIVITAMIN) TABS tablet Take 1 tablet by mouth daily at 12 noon.   08/23/2015 at Unknown time  . amoxicillin-clavulanate (AUGMENTIN) 875-125 MG per tablet Take 1 tablet by mouth every 12 (twelve) hours. (Patient not taking: Reported on 08/23/2015) 14 tablet 0 Completed Course at Unknown time  . hyoscyamine (LEVSIN SL) 0.125 MG SL tablet Place 0.125 mg under the tongue every 4 (four) hours as needed.   Taking  . MOVIPREP 100 G SOLR Movi prep take as directed no substitution (Patient not taking: Reported on 08/23/2015) 1 kit 0    Results for orders placed or performed during the hospital encounter of 08/23/15 (from the past 48 hour(s))  CBC     Status: Abnormal  Collection Time: 08/23/15  2:29 PM  Result Value Ref Range   WBC 9.7 4.0 - 10.5 K/uL   RBC 3.23 (L) 3.87 - 5.11 MIL/uL   Hemoglobin 9.9 (L) 12.0 - 15.0 g/dL   HCT 28.7 (L) 36.0 - 46.0 %   MCV 88.9 78.0 - 100.0 fL   MCH 30.7 26.0 - 34.0 pg   MCHC 34.5 30.0 - 36.0 g/dL   RDW 13.7 11.5 - 15.5 %   Platelets 262 150 - 400 K/uL  Comprehensive metabolic panel     Status: Abnormal   Collection Time: 08/23/15  2:29 PM  Result Value Ref Range   Sodium 132 (L) 135 - 145 mmol/L   Potassium 3.4 (L) 3.5 - 5.1 mmol/L   Chloride 104 101 - 111 mmol/L   CO2 22 22 - 32 mmol/L   Glucose, Bld 106 (H) 65 - 99 mg/dL   BUN 7 6 - 20 mg/dL   Creatinine, Ser 0.54 0.44 - 1.00 mg/dL   Calcium 8.7 (L) 8.9 - 10.3 mg/dL   Total Protein 6.6 6.5 - 8.1 g/dL   Albumin 3.4 (L) 3.5 - 5.0 g/dL   AST 19 15 - 41 U/L   ALT 19 14 - 54 U/L   Alkaline Phosphatase 43 38 - 126 U/L    Total Bilirubin 0.8 0.3 - 1.2 mg/dL   GFR calc non Af Amer >60 >60 mL/min   GFR calc Af Amer >60 >60 mL/min    Comment: (NOTE) The eGFR has been calculated using the CKD EPI equation. This calculation has not been validated in all clinical situations. eGFR's persistently <60 mL/min signify possible Chronic Kidney Disease.    Anion gap 6 5 - 15  TSH     Status: None   Collection Time: 08/23/15  2:30 PM  Result Value Ref Range   TSH 1.698 0.350 - 4.500 uIU/mL    Comment: Performed at [facility]    Review of Systems  Constitutional: Negative for fever and chills.  HENT: Positive for sore throat.   Gastrointestinal: Negative for nausea, vomiting and abdominal pain.  Neurological: Positive for headaches (Very minor. No HA now.).   Physical Exam   Blood pressure 105/71, pulse 69, resp. rate 17, SpO2 100 %.  Physical Exam  Constitutional: She is oriented to person, place, and time. She appears well-developed and well-nourished. No distress.  Intermittent SOB noted during conversation.   HENT:  Head: Normocephalic.  Eyes: Pupils are equal, round, and reactive to light.  Neck: Neck supple.  Cardiovascular: Normal rate and normal heart sounds.   No murmur heard. Respiratory: Effort normal and breath sounds normal. No respiratory distress. She has no wheezes. She has no rales. She exhibits no tenderness.  Musculoskeletal: Normal range of motion.  Neurological: She is alert and oriented to person, place, and time.  Skin: Skin is warm. She is not diaphoretic.  Psychiatric: Her behavior is normal.    MAU Course  Procedures  None  MDM  + fetal heart tones via doppler.  Pulse Ox 100 % on RA Discussed labs and HPI with Dr. Alesia Richards, Dr. Alesia Richards would like the patient to have a breathing treatment and offer her vistaril.  Patient is agreeable to breathing treatment at this time. Patient would like to take vistaril at home.  Discussed patient with Dr. Alesia Richards following breathing  treatment. The patient does not feel the breathing treatment improved her symptoms. Patient is not in distress, pulse ox remains 100% on RA. Patient  is calm, answering questions and conversing appropriately. Dr. Alesia Richards would like the patient to receive vistaril here and if she is feeling improvement with symptoms she is ok to go home. TSH pending  Vistaril 50 mg IM Patient sleeping in the bed when I walked into room. Patient feeling much better, SOB has improved. Husband feels symptoms have improved. Both are ready to go home.   Assessment and Plan   A:  1. Shortness of breath   2. Anxiousness   3. Anemia in pregnancy, second trimester     P:  Discharge home in stable condition Rx: Vistaril, iron  Strict return precautions discussed; patient to return to MAU if symptoms worsen Follow up with [facility] next week.    Lezlie Lye, NP 08/23/2015 8:25 PM

## 2015-08-23 NOTE — Discharge Instructions (Signed)
Shortness of Breath Shortness of breath means you have trouble breathing. Shortness of breath needs medical care right away. HOME CARE   Do not smoke.  Avoid being around chemicals or things (paint fumes, dust) that may bother your breathing.  Rest as needed. Slowly begin your normal activities.  Only take medicines as told by your doctor.  Keep all doctor visits as told. GET HELP RIGHT AWAY IF:   Your shortness of breath gets worse. You feel lightheaded, pass out (faint), or have a cough that is not helped by medicine.Iron Deficiency Anemia, Adult Anemia is a condition in which there are less red blood cells or hemoglobin in the blood than normal. Hemoglobin is the part of red blood cells that carries oxygen. Iron deficiency anemia is anemia caused by too little iron. It is the most common type of anemia. It may leave you tired and short of breath. CAUSES   Lack of iron in the diet.  Poor absorption of iron, as seen with intestinal disorders.  Intestinal bleeding.  Heavy periods. SIGNS AND SYMPTOMS  Mild anemia may not be noticeable. Symptoms may include:  Fatigue.  Headache.  Pale skin.  Weakness.  Tiredness.  Shortness of breath.  Dizziness.  Cold hands and feet.  Fast or irregular heartbeat. DIAGNOSIS  Diagnosis requires a thorough evaluation and physical exam by your health care provider. Blood tests are generally used to confirm iron deficiency anemia. Additional tests may be done to find the underlying cause of your anemia. These may include:  Testing for blood in the stool (fecal occult blood test).  A procedure to see inside the colon and rectum (colonoscopy).  A procedure to see inside the esophagus and stomach (endoscopy). TREATMENT  Iron deficiency anemia is treated by correcting the cause of the deficiency. Treatment may involve:  Adding iron-rich foods to your diet.  Taking iron supplements. Pregnant or breastfeeding women need to take extra  iron because their normal diet usually does not provide the required amount.  Taking vitamins. Vitamin C improves the absorption of iron. Your health care provider may recommend that you take your iron tablets with a glass of orange juice or vitamin C supplement.  Medicines to make heavy menstrual flow lighter.  Surgery. HOME CARE INSTRUCTIONS   Take iron as directed by your health care provider.  If you cannot tolerate taking iron supplements by mouth, talk to your health care provider about taking them through a vein (intravenously) or an injection into a muscle.  For the best iron absorption, iron supplements should be taken on an empty stomach. If you cannot tolerate them on an empty stomach, you may need to take them with food.  Do not drink milk or take antacids at the same time as your iron supplements. Milk and antacids may interfere with the absorption of iron.  Iron supplements can cause constipation. Make sure to include fiber in your diet to prevent constipation. A stool softener may also be recommended.  Take vitamins as directed by your health care provider.  Eat a diet rich in iron. Foods high in iron include liver, lean beef, whole-grain bread, eggs, dried fruit, and dark green leafy vegetables. SEEK IMMEDIATE MEDICAL CARE IF:   You faint. If this happens, do not drive. Call your local emergency services (911 in [place] if no other help is available.  You have chest pain.  You feel nauseous or vomit.  You have severe or increased shortness of breath with activity.  You feel weak.  You have a rapid heartbeat.  You have unexplained sweating.  You become light-headed when getting up from a chair or bed. MAKE SURE YOU:   Understand these instructions.  Will watch your condition.  Will get help right away if you are not doing well or get worse.   This information is not intended to replace advice given to you by your health care provider. Make sure you discuss  any questions you have with your health care provider.   Document Released: 01/31/2000 Document Revised: 02/23/2014 Document Reviewed: 10/10/2012 Elsevier Interactive Patient Education 2016 [employer].    You cough up blood.  You have pain with breathing.  You have pain in your chest, arms, shoulders, or belly (abdomen).  You have a fever.  You cannot walk up stairs or exercise the way you normally do.  You do not get better in the time expected.  You have a hard time doing normal activities even with rest.  You have problems with your medicines.  You have any new symptoms. MAKE SURE YOU:  Understand these instructions.  Will watch your condition.  Will get help right away if you are not doing well or get worse.   This information is not intended to replace advice given to you by your health care provider. Make sure you discuss any questions you have with your health care provider.   Document Released: 07/22/2007 Document Revised: 02/07/2013 Document Reviewed: 04/20/2011 Elsevier Interactive Patient Education [employer]

## 2015-09-16 DIAGNOSIS — Z36 Encounter for antenatal screening of mother: Secondary | ICD-10-CM | POA: Diagnosis not present

## 2015-10-04 DIAGNOSIS — Z36 Encounter for antenatal screening of mother: Secondary | ICD-10-CM | POA: Diagnosis not present

## 2015-10-04 DIAGNOSIS — Z23 Encounter for immunization: Secondary | ICD-10-CM | POA: Diagnosis not present

## 2015-10-04 LAB — OB RESULTS CONSOLE RUBELLA ANTIBODY, IGM: RUBELLA: IMMUNE

## 2015-10-04 LAB — OB RESULTS CONSOLE HIV ANTIBODY (ROUTINE TESTING): HIV: NONREACTIVE

## 2015-10-04 LAB — OB RESULTS CONSOLE ABO/RH: RH TYPE: NEGATIVE

## 2015-10-04 LAB — OB RESULTS CONSOLE ANTIBODY SCREEN: Antibody Screen: NEGATIVE

## 2015-10-04 LAB — OB RESULTS CONSOLE GBS: GBS: POSITIVE

## 2015-10-04 LAB — OB RESULTS CONSOLE RPR: RPR: NONREACTIVE

## 2015-10-04 LAB — OB RESULTS CONSOLE HEPATITIS B SURFACE ANTIGEN: Hepatitis B Surface Ag: NEGATIVE

## 2015-10-11 DIAGNOSIS — Z3A29 29 weeks gestation of pregnancy: Secondary | ICD-10-CM | POA: Diagnosis not present

## 2015-10-11 DIAGNOSIS — O99810 Abnormal glucose complicating pregnancy: Secondary | ICD-10-CM | POA: Diagnosis not present

## 2015-11-01 DIAGNOSIS — Z23 Encounter for immunization: Secondary | ICD-10-CM | POA: Diagnosis not present

## 2015-11-13 DIAGNOSIS — B373 Candidiasis of vulva and vagina: Secondary | ICD-10-CM | POA: Diagnosis not present

## 2015-11-13 DIAGNOSIS — Z3A34 34 weeks gestation of pregnancy: Secondary | ICD-10-CM | POA: Diagnosis not present

## 2015-11-13 DIAGNOSIS — O99013 Anemia complicating pregnancy, third trimester: Secondary | ICD-10-CM | POA: Diagnosis not present

## 2015-11-13 DIAGNOSIS — N898 Other specified noninflammatory disorders of vagina: Secondary | ICD-10-CM | POA: Diagnosis not present

## 2015-12-26 ENCOUNTER — Inpatient Hospital Stay (HOSPITAL_COMMUNITY): Payer: BLUE CROSS/BLUE SHIELD | Admitting: Anesthesiology

## 2015-12-26 ENCOUNTER — Encounter (HOSPITAL_COMMUNITY): Payer: Self-pay

## 2015-12-26 ENCOUNTER — Inpatient Hospital Stay (HOSPITAL_COMMUNITY)
Admission: AD | Admit: 2015-12-26 | Discharge: 2015-12-29 | DRG: 775 | Disposition: A | Discharge status: Home / Self Care | Payer: BLUE CROSS/BLUE SHIELD | Source: Ambulatory Visit | Attending: Obstetrics & Gynecology | Admitting: Obstetrics & Gynecology

## 2015-12-26 DIAGNOSIS — J45909 Unspecified asthma, uncomplicated: Secondary | ICD-10-CM | POA: Diagnosis present

## 2015-12-26 DIAGNOSIS — Z8249 Family history of ischemic heart disease and other diseases of the circulatory system: Secondary | ICD-10-CM

## 2015-12-26 DIAGNOSIS — Z3A00 Weeks of gestation of pregnancy not specified: Secondary | ICD-10-CM | POA: Diagnosis not present

## 2015-12-26 DIAGNOSIS — O43123 Velamentous insertion of umbilical cord, third trimester: Secondary | ICD-10-CM | POA: Diagnosis present

## 2015-12-26 DIAGNOSIS — D509 Iron deficiency anemia, unspecified: Secondary | ICD-10-CM | POA: Diagnosis not present

## 2015-12-26 DIAGNOSIS — Z3A40 40 weeks gestation of pregnancy: Secondary | ICD-10-CM

## 2015-12-26 DIAGNOSIS — Z3403 Encounter for supervision of normal first pregnancy, third trimester: Secondary | ICD-10-CM | POA: Diagnosis not present

## 2015-12-26 DIAGNOSIS — O9952 Diseases of the respiratory system complicating childbirth: Secondary | ICD-10-CM | POA: Diagnosis present

## 2015-12-26 DIAGNOSIS — G35 Multiple sclerosis: Secondary | ICD-10-CM | POA: Diagnosis present

## 2015-12-26 DIAGNOSIS — O9902 Anemia complicating childbirth: Principal | ICD-10-CM | POA: Diagnosis present

## 2015-12-26 DIAGNOSIS — O99824 Streptococcus B carrier state complicating childbirth: Secondary | ICD-10-CM | POA: Diagnosis present

## 2015-12-26 DIAGNOSIS — Z833 Family history of diabetes mellitus: Secondary | ICD-10-CM

## 2015-12-26 DIAGNOSIS — O99013 Anemia complicating pregnancy, third trimester: Secondary | ICD-10-CM | POA: Diagnosis present

## 2015-12-26 DIAGNOSIS — Z23 Encounter for immunization: Secondary | ICD-10-CM | POA: Diagnosis not present

## 2015-12-26 HISTORY — DX: Iron deficiency anemia, unspecified: D50.9

## 2015-12-26 HISTORY — DX: Anemia complicating pregnancy, third trimester: O99.013

## 2015-12-26 LAB — CBC
HCT: 35.1 % — ABNORMAL LOW (ref 36.0–46.0)
Hemoglobin: 12.2 g/dL (ref 12.0–15.0)
MCH: 31.3 pg (ref 26.0–34.0)
MCHC: 34.8 g/dL (ref 30.0–36.0)
MCV: 90 fL (ref 78.0–100.0)
Platelets: 279 K/uL (ref 150–400)
RBC: 3.9 MIL/uL (ref 3.87–5.11)
RDW: 14 % (ref 11.5–15.5)
WBC: 19.8 K/uL — ABNORMAL HIGH (ref 4.0–10.5)

## 2015-12-26 LAB — TYPE AND SCREEN
ABO/RH(D): A NEG
Antibody Screen: NEGATIVE

## 2015-12-26 LAB — ABO/RH: ABO/RH(D): A NEG

## 2015-12-26 MED ORDER — OXYTOCIN BOLUS FROM INFUSION: 500.0000 mL | Freq: Once | INTRAVENOUS | Status: DC

## 2015-12-26 MED ORDER — EPHEDRINE 5 MG/ML INJ
10.0000 mg | INTRAVENOUS | Status: DC | PRN
  Filled 2015-12-26: qty 4

## 2015-12-26 MED ORDER — PHENYLEPHRINE 40 MCG/ML (10ML) SYRINGE FOR IV PUSH (FOR BLOOD PRESSURE SUPPORT)
80.0000 ug | PREFILLED_SYRINGE | INTRAVENOUS | Status: DC | PRN
  Filled 2015-12-26: qty 5

## 2015-12-26 MED ORDER — ONDANSETRON HCL 4 MG/2ML IJ SOLN
4.0000 mg | Freq: Four times a day (QID) | INTRAMUSCULAR | Status: DC | PRN
  Administered 2015-12-26 – 2015-12-27 (×2): 4 mg via INTRAVENOUS
  Filled 2015-12-26 (×2): qty 2

## 2015-12-26 MED ORDER — OXYTOCIN 40 UNITS IN LACTATED RINGERS INFUSION - SIMPLE MED
1.0000 m[IU]/min | INTRAVENOUS | Status: DC
  Administered 2015-12-26: 4 m[IU]/min via INTRAVENOUS
  Administered 2015-12-26: 2 m[IU]/min via INTRAVENOUS

## 2015-12-26 MED ORDER — SOD CITRATE-CITRIC ACID 500-334 MG/5ML PO SOLN: 30.0000 mL | ORAL | Status: DC | PRN

## 2015-12-26 MED ORDER — FENTANYL 2.5 MCG/ML BUPIVACAINE 1/10 % EPIDURAL INFUSION (WH - ANES)
14.0000 mL/h | INTRAMUSCULAR | Status: DC | PRN
  Administered 2015-12-26: 14 mL/h via EPIDURAL
  Filled 2015-12-26 (×2): qty 100

## 2015-12-26 MED ORDER — DIPHENHYDRAMINE HCL 50 MG/ML IJ SOLN: 12.5000 mg | INTRAMUSCULAR | Status: DC | PRN

## 2015-12-26 MED ORDER — ACETAMINOPHEN 325 MG PO TABS: 650.0000 mg | ORAL_TABLET | ORAL | Status: DC | PRN

## 2015-12-26 MED ORDER — LIDOCAINE HCL (PF) 1 % IJ SOLN
30.0000 mL | INTRAMUSCULAR | Status: DC | PRN
  Filled 2015-12-26: qty 30

## 2015-12-26 MED ORDER — LACTATED RINGERS IV SOLN: 500.0000 mL | Freq: Once | INTRAVENOUS | Status: DC

## 2015-12-26 MED ORDER — LIDOCAINE HCL (PF) 1 % IJ SOLN
INTRAMUSCULAR | Status: DC | PRN
  Administered 2015-12-26 (×2): 7 mL via EPIDURAL

## 2015-12-26 MED ORDER — PENICILLIN G POTASSIUM 5000000 UNITS IJ SOLR
2.5000 10*6.[IU] | INTRAVENOUS | Status: DC
  Administered 2015-12-26 – 2015-12-27 (×2): 2.5 10*6.[IU] via INTRAVENOUS
  Filled 2015-12-26 (×5): qty 2.5

## 2015-12-26 MED ORDER — LACTATED RINGERS IV SOLN
500.0000 mL | INTRAVENOUS | Status: DC | PRN
Start: 2015-12-26 — End: 2015-12-27

## 2015-12-26 MED ORDER — ONDANSETRON HCL 4 MG/2ML IJ SOLN: 4.0000 mg | Freq: Four times a day (QID) | INTRAMUSCULAR | Status: DC

## 2015-12-26 MED ORDER — PHENYLEPHRINE 40 MCG/ML (10ML) SYRINGE FOR IV PUSH (FOR BLOOD PRESSURE SUPPORT)
80.0000 ug | PREFILLED_SYRINGE | INTRAVENOUS | Status: DC | PRN
  Filled 2015-12-26: qty 10
  Filled 2015-12-26: qty 5

## 2015-12-26 MED ORDER — SODIUM CHLORIDE 0.9 % IV SOLN: 250.0000 mL | INTRAVENOUS | Status: DC | PRN

## 2015-12-26 MED ORDER — PENICILLIN G POTASSIUM 5000000 UNITS IJ SOLR
5.0000 10*6.[IU] | Freq: Once | INTRAVENOUS | Status: AC
  Administered 2015-12-26: 5 10*6.[IU] via INTRAVENOUS
  Filled 2015-12-26: qty 5

## 2015-12-26 MED ORDER — OXYTOCIN 10 UNIT/ML IJ SOLN
10.0000 [IU] | Freq: Once | INTRAMUSCULAR | Status: DC | PRN
  Filled 2015-12-26: qty 1

## 2015-12-26 MED ORDER — OXYTOCIN 40 UNITS IN LACTATED RINGERS INFUSION - SIMPLE MED
2.5000 [IU]/h | INTRAVENOUS | Status: DC
  Filled 2015-12-26: qty 1000

## 2015-12-26 MED ORDER — OXYCODONE-ACETAMINOPHEN 5-325 MG PO TABS: 1.0000 | ORAL_TABLET | ORAL | Status: DC | PRN

## 2015-12-26 MED ORDER — OXYCODONE-ACETAMINOPHEN 5-325 MG PO TABS: 2.0000 | ORAL_TABLET | ORAL | Status: DC | PRN

## 2015-12-26 MED ORDER — SODIUM CHLORIDE 0.9% FLUSH: 3.0000 mL | Freq: Two times a day (BID) | INTRAVENOUS | Status: DC

## 2015-12-26 MED ORDER — SODIUM CHLORIDE 0.9% FLUSH: 3.0000 mL | INTRAVENOUS | Status: DC | PRN

## 2015-12-26 NOTE — Anesthesia Preprocedure Evaluation (Signed)
Anesthesia Evaluation  Patient identified by MRN, date of birth, ID band Patient awake    Reviewed: Allergy & Precautions, H&P , Patient's Chart, lab work & pertinent test results  Airway Mallampati: I  TM Distance: >3 FB Neck ROM: full    Dental no notable dental hx.    Pulmonary    Pulmonary exam normal        Cardiovascular negative cardio ROS Normal cardiovascular exam     Neuro/Psych negative neurological ROS     GI/Hepatic negative GI ROS, Neg liver ROS,   Endo/Other  negative endocrine ROS  Renal/GU negative Renal ROS     Musculoskeletal   Abdominal Normal abdominal exam  (+)   Peds  Hematology   Anesthesia Other Findings   Reproductive/Obstetrics (+) Pregnancy                             Anesthesia Physical Anesthesia Plan  ASA: II  Anesthesia Plan: Epidural   Post-op Pain Management:    Induction:   Airway Management Planned:   Additional Equipment:   Intra-op Plan:   Post-operative Plan:   Informed Consent: I have reviewed the patients History and Physical, chart, labs and discussed the procedure including the risks, benefits and alternatives for the proposed anesthesia with the patient or authorized representative who has indicated his/her understanding and acceptance.     Plan Discussed with:   Anesthesia Plan Comments:         Anesthesia Quick Evaluation

## 2015-12-26 NOTE — H&P (Signed)
OB ADMISSION/ HISTORY & PHYSICAL:  Admission Date: 12/26/2015  3:19 PM  Admit Diagnosis: Active Labor at term   Crystal Wilkerson is a 35 y.o. female presenting for contractions every 2 mins.  Prenatal History: G1P0   EDC : 12/22/2015, by  Prenatal care at [facility] since 24.[redacted] weeks gestation Primary Care Provider at [facility]: Marlinda Mike, CNM  Prenatal course complicated by: new MS diagnosis (asymptomatic) / Iron Deficiency Anemia  Prenatal Labs: ABO, Rh: A (08/18 0000)  Antibody: Negative (08/18 0000) Rubella: Immune (08/18 0000)  RPR: Nonreactive (08/18 0000)  HBsAg: Negative (08/18 0000)  HIV: Non-reactive (08/18 0000)  GBS: Positive (08/18 0000)  GTT: Abnormal 1 hr  / Normal 3 hr GTT   Medical / Surgical History :  Past medical history:  Past Medical History:  Diagnosis Date  . Anemia   . Anxiety   . Asthma    mild on exertion  . Depression   . Interstitial cystitis   . Maternal iron deficiency anemia affecting pregnancy in third trimester, antepartum 12/26/2015  . UTI (lower urinary tract infection)      Past surgical history:  Past Surgical History:  Procedure Laterality Date  . bladder enlarged     2006     Family History:  Family History  Problem Relation Age of Onset  . Breast cancer Paternal Grandmother   . Heart failure Paternal Grandmother   . Heart disease Maternal Grandfather     all grandparents  . Diabetes Maternal Grandmother      Social History:  reports that she has never smoked. She has never used smokeless tobacco. She reports that she drinks about 0.6 oz of alcohol per week . She reports that she does not use drugs.   Allergies: Patient has no known allergies.    Current Medications at time of admission:  Prescriptions Prior to Admission  Medication Sig Dispense Refill Last Dose  . cyanocobalamin 100 MCG tablet Take 100 mcg by mouth daily.   08/23/2015 at Unknown time  . ferrous sulfate 325 (65 FE) MG  tablet Take 1 tablet (325 mg total) by mouth daily with breakfast. 30 tablet 0   . folic acid (FOLVITE) 400 MCG tablet Take 400 mcg by mouth daily.   08/23/2015 at Unknown time  . hydrOXYzine (VISTARIL) 25 MG capsule Take 1 capsule (25 mg total) by mouth 3 (three) times daily as needed. 30 capsule 0   . LYSINE ACETATE PO Take 1 tablet by mouth daily.   08/23/2015 at Unknown time  . OVER THE COUNTER MEDICATION Take 2 tablets by mouth 3 (three) times daily. Pt takes a medication called Wholezymes. (For digestion)   08/21/2015  . Prenatal Vit-Fe Fumarate-FA (PRENATAL MULTIVITAMIN) TABS tablet Take 1 tablet by mouth daily at 12 noon.   08/23/2015 at Unknown time      Review of Systems: Active FM onset of ctx this AM currently every 2 minutes bloody show (+)   Physical Exam:  Today's Vitals   12/26/15 1528 12/26/15 1529 12/26/15 1632 12/26/15 1647  BP: 127/78   140/74  Pulse: 89   91  Resp: 20   (!) 22  Temp: 97.9 F (36.6 C)  98.1 F (36.7 C)   TempSrc:   Oral   Weight:  86.2 kg (190 lb)    Height:  5\' 9"  (1.753 m)    PainSc:  7      General: alert and oriented x 3, NAD Heart: RRR, no murmurs  Lungs: CTAB Abdomen: Soft and non-tender, non-distended / Uterus: Gravid, non-tender Extremities: No edema Genitalia / VE: Dilation: 5.5 Effacement (%): 70 Station: -3 Exam by:: Ginnie Smartachel Schmidt RN FHR: 120 bpm / moderate variability / accels present / no decels TOCO: regular every 2-2.5 mins  Labs:    No results for input(s): WBC, HGB, HCT, PLT in the last 72 hours.   Assessment:  35 y.o. G1P0 at 966w4d (+) GBS MS  1. Labor: Active 2. Fetal Wellbeing: Category 1  3. Pain Control: planning water immersion 4. GBS: Positive   Plan:  1. Admit to BS 2. Routine L&D orders 3. PCN G 5 million, then 2.5 million units every 4 hrs 4. May labor in the water     Dr. Juliene PinaMody notified of admission / plan of care    Kenard GowerDAWSON, Jalyric Kaestner, M, MSN, CNM 12/26/2015, 4:23 PM

## 2015-12-26 NOTE — Anesthesia Procedure Notes (Signed)
Epidural Patient location during procedure: OB Start time: 12/26/2015 6:33 PM End time: 12/26/2015 6:37 PM  Staffing Anesthesiologist: Leilani Able Performed: anesthesiologist   Preanesthetic Checklist Completed: patient identified, surgical consent, pre-op evaluation, timeout performed, IV checked, risks and benefits discussed and monitors and equipment checked  Epidural Patient position: sitting Prep: site prepped and draped and DuraPrep Patient monitoring: continuous pulse ox and blood pressure Approach: midline Location: L3-L4 Injection technique: LOR air  Needle:  Needle type: Tuohy  Needle gauge: 17 G Needle length: 9 cm and 9 Needle insertion depth: 5 cm cm Catheter type: closed end flexible Catheter size: 19 Gauge Catheter at skin depth: 10 cm Test dose: negative and Other  Assessment Sensory level: T9 Events: blood not aspirated, injection not painful, no injection resistance, negative IV test and no paresthesia  Additional Notes Reason for block:procedure for pain

## 2015-12-26 NOTE — Progress Notes (Signed)
Patient ID: Crystal Wilkerson, female   DOB: Aug 19, 1980, 35 y.o.   MRN: [id number] Subjective: Crystal Wilkerson is a 35 y.o. G1P0 at [redacted]w[redacted]d by LMP admitted for active labor. Planning water immersion and water birth.  Objective: Vitals:   12/26/15 1854 12/26/15 1859 12/26/15 1904 12/26/15 1930  BP: 129/80 (!) 147/66 126/84 138/67  Pulse: 96 (!) 108 (!) 101 96  Resp:      Temp:      TempSrc:      Weight:      Height:          FHT:  FHR: 120 bpm, variability: moderate,  accelerations:  Present,  decelerations:  Absent UC:   regular, every 2-2.5 minutes SVE:   Dilation: 7.5 Effacement (%): 90 Station: 0 Exam by: Carloyn Jaeger, CNM  Labs:    Recent Labs  12/26/15 1620  WBC 19.8*  HGB 12.2  HCT 35.1*  PLT 279    Assessment / Plan: Spontaneous labor, progressing normally (+) GBS Iron deficiency anemia  Labor: Progressing normally Preeclampsia:  N/A Fetal Wellbeing:  Category I Pain Control:  Water tub / Time in Tub: 1738, water temp: 98.7; 100.3 at 1745 / request for epidural and out of water tub @ 1800 Anticipated MOD:  NSVD  Kenard Gower, MSN, CNM 12/26/2015, 6:00 PM

## 2015-12-26 NOTE — MAU Note (Signed)
Pt in labor, was in office this morning 4 cm

## 2015-12-27 ENCOUNTER — Encounter (HOSPITAL_COMMUNITY): Payer: Self-pay | Admitting: *Deleted

## 2015-12-27 LAB — RPR: RPR: NONREACTIVE

## 2015-12-27 MED ORDER — IBUPROFEN 600 MG PO TABS
600.0000 mg | ORAL_TABLET | Freq: Four times a day (QID) | ORAL | Status: DC
  Administered 2015-12-27 – 2015-12-29 (×10): 600 mg via ORAL
  Filled 2015-12-27 (×10): qty 1

## 2015-12-27 MED ORDER — COCONUT OIL OIL
1.0000 "application " | TOPICAL_OIL | Status: DC | PRN
  Administered 2015-12-27: 1 via TOPICAL
  Filled 2015-12-27: qty 120

## 2015-12-27 MED ORDER — WITCH HAZEL-GLYCERIN EX PADS: 1.0000 "application " | MEDICATED_PAD | CUTANEOUS | Status: DC | PRN

## 2015-12-27 MED ORDER — SENNOSIDES-DOCUSATE SODIUM 8.6-50 MG PO TABS
2.0000 | ORAL_TABLET | ORAL | Status: DC
  Administered 2015-12-27 – 2015-12-29 (×2): 2 via ORAL
  Filled 2015-12-27 (×2): qty 2

## 2015-12-27 MED ORDER — DIPHENHYDRAMINE HCL 25 MG PO CAPS: 25.0000 mg | ORAL_CAPSULE | Freq: Four times a day (QID) | ORAL | Status: DC | PRN

## 2015-12-27 MED ORDER — DIBUCAINE 1 % RE OINT: 1.0000 "application " | TOPICAL_OINTMENT | RECTAL | Status: DC | PRN

## 2015-12-27 MED ORDER — PRENATAL MULTIVITAMIN CH
1.0000 | ORAL_TABLET | Freq: Every day | ORAL | Status: DC
  Administered 2015-12-27 – 2015-12-29 (×3): 1 via ORAL
  Filled 2015-12-27 (×3): qty 1

## 2015-12-27 MED ORDER — ONDANSETRON HCL 4 MG PO TABS: 4.0000 mg | ORAL_TABLET | ORAL | Status: DC | PRN

## 2015-12-27 MED ORDER — ZOLPIDEM TARTRATE 5 MG PO TABS: 5.0000 mg | ORAL_TABLET | Freq: Every evening | ORAL | Status: DC | PRN

## 2015-12-27 MED ORDER — SIMETHICONE 80 MG PO CHEW: 80.0000 mg | CHEWABLE_TABLET | ORAL | Status: DC | PRN

## 2015-12-27 MED ORDER — ACETAMINOPHEN 325 MG PO TABS: 650.0000 mg | ORAL_TABLET | ORAL | Status: DC | PRN

## 2015-12-27 MED ORDER — TETANUS-DIPHTH-ACELL PERTUSSIS 5-2.5-18.5 LF-MCG/0.5 IM SUSP: 0.5000 mL | Freq: Once | INTRAMUSCULAR | Status: DC

## 2015-12-27 MED ORDER — BENZOCAINE-MENTHOL 20-0.5 % EX AERO
1.0000 "application " | INHALATION_SPRAY | CUTANEOUS | Status: DC | PRN
  Administered 2015-12-27: 1 via TOPICAL
  Filled 2015-12-27: qty 56

## 2015-12-27 MED ORDER — ONDANSETRON HCL 4 MG/2ML IJ SOLN: 4.0000 mg | INTRAMUSCULAR | Status: DC | PRN

## 2015-12-27 NOTE — Anesthesia Postprocedure Evaluation (Signed)
Anesthesia Post Note  Patient: Crystal Wilkerson  Procedure(s) Performed: * No procedures listed *  Patient location during evaluation: Mother Baby Anesthesia Type: Epidural Level of consciousness: awake and alert Pain management: pain level controlled Vital Signs Assessment: post-procedure vital signs reviewed and stable Respiratory status: spontaneous breathing, nonlabored ventilation and respiratory function stable Cardiovascular status: stable Postop Assessment: no headache, no backache, epidural receding and patient able to bend at knees Anesthetic complications: no     Last Vitals:  Vitals:   12/27/15 0530 12/27/15 0630  BP: 112/62 108/63  Pulse: 89 91  Resp: 18 18  Temp:  36.8 C    Last Pain:  Vitals:   12/27/15 0630  TempSrc: Oral  PainSc: 2    Pain Goal: Patients Stated Pain Goal: 2 (12/27/15 0630)               Rica Records

## 2015-12-27 NOTE — Progress Notes (Signed)
INTERVAL NOTE:  S:  Sitting in bed, BFing, min cramping, (+) voids, small bleed, denies HA/NV/dizziness  O:  VSS, AAO x 3, NAD  FF bellow U  Scant lochia  A / P:   PPD #0  Stable post partum  Routine PP orders  Anthon Harpole, CNM, MSN  12/27/2015 11:26 AM

## 2015-12-27 NOTE — Lactation Note (Signed)
This note was copied from a baby's chart. Lactation Consultation Note  Patient Name: Crystal Wilkerson FUXNA'T Date: 12/27/2015 Reason for consult: Initial assessment Assisted Mom to obtain more depth with latch. Basic teaching reviewed, advised to continue to BF with feeding ques. Lactation brochure left for review, advised of OP services and support group. Encouraged to call for assist as needed, questions/concerns.   Maternal Data Has patient been taught Hand Expression?: Yes Does the patient have breastfeeding experience prior to this delivery?: No  Feeding Feeding Type: Breast Fed Length of feed: 10 min  LATCH Score/Interventions Latch: Grasps breast easily, tongue down, lips flanged, rhythmical sucking.  Audible Swallowing: A few with stimulation  Type of Nipple: Everted at rest and after stimulation  Comfort (Breast/Nipple): Soft / non-tender     Hold (Positioning): Assistance needed to correctly position infant at breast and maintain latch. Intervention(s): Breastfeeding basics reviewed;Support Pillows;Position options;Skin to skin  LATCH Score: 8  Lactation Tools Discussed/Used WIC Program: No   Consult Status Consult Status: Follow-up Date: 12/28/15 Follow-up type: In-patient    Alfred Levins 12/27/2015, 2:13 PM

## 2015-12-28 LAB — CBC
HCT: 26.9 % — ABNORMAL LOW (ref 36.0–46.0)
HEMOGLOBIN: 9.4 g/dL — AB (ref 12.0–15.0)
MCH: 32 pg (ref 26.0–34.0)
MCHC: 34.9 g/dL (ref 30.0–36.0)
MCV: 91.5 fL (ref 78.0–100.0)
Platelets: 213 10*3/uL (ref 150–400)
RBC: 2.94 MIL/uL — AB (ref 3.87–5.11)
RDW: 14.6 % (ref 11.5–15.5)
WBC: 15 10*3/uL — AB (ref 4.0–10.5)

## 2015-12-28 NOTE — Progress Notes (Signed)
MOB was referred for history of depression/anxiety.  Referral is screened out by Clinical Social Worker because none of the following criteria appear to apply and there are no reports impacting the pregnancy or her transition to the postpartum period.  CSW does not deem it clinically necessary to further investigate at this time.   -History of anxiety/depression during this pregnancy, or of post-partum depression.  - Diagnosis of anxiety and/or depression within last 3 years.-  - History of depression due to pregnancy loss/loss of child or -MOB's symptoms are currently being treated with medication and/or therapy.  Please contact the Clinical Social Worker if needs arise or upon MOB request.    Per chart review, OBGYN-PNC note states MOB dealt with anxiety/depression as a young child and is no longer dealing with it.   Crystal Wilkerson, MSW, LCSW-A Clinical Social Worker  [facility]  Office: [phone]

## 2015-12-28 NOTE — Lactation Note (Signed)
This note was copied from a baby's chart. Lactation Consultation Note  Patient Name: Crystal Wilkerson VHQIO'NToday's Date: 12/28/2015 Reason for consult: Follow-up assessment  Follow up assessment with 42 hour old infant. Infant with 8 BF for 10-45 minutes, 3 voids and 7 stools in 24 hours preceding this assessment. LATCH scores 8-9 by bedside RN's. Infant weight 7 lb 4.6 oz with weight loss of 3% since birth. Infant in a deep sleep STS with mom.   Mom reports some nipple tenderness with latch to right nipple, nipple is noted to be pink. Mom is using coconut oil to nipple post BF. Enc mom to continue to feed 8-12 x in 24 hours at first feeding cues. Mom asked several general BF questions that were answered. Mom reports her breasts are feeling and looking fuller today. Follow up tomorrow and prn.       Maternal Data Formula Feeding for Exclusion: No Does the patient have breastfeeding experience prior to this delivery?: No  Feeding    LATCH Score/Interventions                      Lactation Tools Discussed/Used     Consult Status Consult Status: Follow-up Date: 12/29/15 Follow-up type: In-patient    Silas FloodSharon S Devlin Mcveigh 12/28/2015, 9:51 PM

## 2015-12-28 NOTE — Progress Notes (Signed)
PPD 1 SVD - intact  S:  Reports feeling well             Tolerating po/ No nausea or vomiting             Bleeding is moderate             Pain controlled with motrin             Up ad lib / ambulatory / voiding QS  Newborn breast feeding O:               VS: BP (!) 102/56   Pulse 64   Temp 97.5 F (36.4 C)   Resp 18   Ht 5\' 9"  (1.753 m)   Wt 86.2 kg (190 lb)   SpO2 98%   Breastfeeding? Unknown   BMI 28.06 kg/m    LABS:              Recent Labs  12/26/15 1620 12/28/15 0537  WBC 19.8* 15.0*  HGB 12.2 9.4*  PLT 279 213               Blood type: --/--/A NEG, A NEG (11/09 1620)                                 Newborn A negative   Rubella: Immune (08/18 0000)                     Physical Exam:             Alert and oriented X3  Abdomen: soft, non-tender, non-distended              Fundus: firm, non-tender, U-1  Perineum: ice pack in place  Lochia: light  Extremities: no edema, no calf pain or tenderness    A: PPD # 1   Doing well - stable status  P: Routine post partum orders  DC tomorrow             No rhogam indicated  Marlinda Mike CNM, MSN, FACNM 12/28/2015, 10:05 AM

## 2015-12-29 MED ORDER — IBUPROFEN 600 MG PO TABS: 600.0000 mg | ORAL_TABLET | Freq: Four times a day (QID) | ORAL | 0 refills | Status: DC

## 2015-12-29 NOTE — Progress Notes (Signed)
PPD 2 SVD  S:  Reports feeling great - some sleep with interval feedings             Tolerating po/ No nausea or vomiting             Bleeding is light             Pain controlled with motrin             Up ad lib / ambulatory / voiding QS  Newborn breast feeding  O:   VS: BP 104/64   Pulse 74   Temp 98.5 F (36.9 C)   Resp 18   Ht 5\' 9"  (1.753 m)   Wt 86.2 kg (190 lb)   SpO2 98%   Breastfeeding? Unknown   BMI 28.06 kg/m               Physical Exam:             Alert and oriented X3             Breast engorged this am with milk coming in  Abdomen: soft, non-tender, non-distended              Fundus: firm, non-tender, U-1  Perineum:intact  Lochia: light  Extremities: no edema, no calf pain or tenderness   A: PPD # 2   Doing well - stable status  P: Routine post partum orders  DC home - WOB booklet - instructions reviewed  Crystal Wilkerson, Crystal Wilkerson CNM, MSN, [facility] CenterFACNM 12/29/2015, 5:56 AM

## 2015-12-29 NOTE — Lactation Note (Signed)
This note was copied from a baby's chart. Lactation Consultation Note: Mother just finished feeding infant on the (R) breast. She states that her (L) breast began to leak while feeding on the (R). Mothers breast are full. Mother advised to massage and use ice if becomes to swollen. Mother was given a harmony hand pump to pre pump if needed. Infant is a little jaundice . Mother is sitting on sofa to allow infant to be in the sun light. Infant sound asleep. Suggested that if mother uses hand pump and get milk give back to infant with spoon or curved tip syringe. Reviewed S/S of severe engorgement and Mastitis. Mother is aware of all breastfeeding resources .   Patient Name: Crystal Wilkerson CBJSE'G Date: 12/29/2015 Reason for consult: Follow-up assessment   Maternal Data    Feeding Feeding Type: Breast Fed Length of feed: 15 min (per mother)  [facility] Score/Interventions                      Lactation Tools Discussed/Used     Consult Status      Michel Bickers 12/29/2015, 9:40 AM

## 2015-12-29 NOTE — Discharge Summary (Signed)
Obstetric Discharge Summary Reason for Admission: onset of labor Prenatal Procedures: none Intrapartum Procedures: spontaneous vaginal delivery and GBS prophylaxis Postpartum Procedures: none Complications-Operative and Postpartum: none Hemoglobin  Date Value Ref Range Status  12/28/2015 9.4 (L) 12.0 - 15.0 g/dL Final    Comment:    DELTA CHECK NOTED REPEATED TO VERIFY    HCT  Date Value Ref Range Status  12/28/2015 26.9 (L) 36.0 - 46.0 % Final    Physical Exam:  General: no distress Lochia: appropriate Uterine Fundus: firm Perineum: intact DVT Evaluation: No evidence of DVT seen on physical exam.  Discharge Diagnoses: Term Pregnancy-delivered and IDA of pregnancy  Discharge Information: Date: 12/29/2015 Activity: pelvic rest Diet: routine Medications: PNV, Ibuprofen, Iron and magnesium Condition: stable Instructions: refer to practice specific booklet Discharge to: home Follow-up Information    Neta Mends, CNM. Schedule an appointment as soon as possible for a visit in 6 week(s).   Specialty:  Obstetrics and Gynecology Contact information: [address] [place] [address] [phone]           Newborn Data: Live born female  Birth Weight: 7 lb 8 oz (3402 g) APGAR: 7, 9  Home with mother.  Marlinda Mike 12/29/2015, 5:58 AM

## 2015-12-29 NOTE — Lactation Note (Signed)
This note was copied from a baby's chart. Lactation Consultation Note: assist mother with pumping and good breast massage. Mothers breast are full and congested. Mother pumped approx 250 ml . Mother breast softened . Advised mother to pump just for relief the next pumping . Advised to allow for infant to drain breast well. Mother has ice to breast for 15 mins. Mother states she may be discharged this pm. Advised mother to supplement infant with some of the ebm if needed.   Patient Name: Crystal Wilkerson UDJSH'F Date: 12/29/2015 Reason for consult: Follow-up assessment   Maternal Data    Feeding Feeding Type: Breast Fed  LATCH Score/Interventions Latch: Grasps breast easily, tongue down, lips flanged, rhythmical sucking.  Audible Swallowing: Spontaneous and intermittent Intervention(s): Hand expression  Type of Nipple: Everted at rest and after stimulation  Comfort (Breast/Nipple): Filling, red/small blisters or bruises, mild/mod discomfort  Problem noted: Filling Interventions (Filling): Massage;Firm support;Frequent nursing;Double electric pump  Hold (Positioning): No assistance needed to correctly position infant at breast. Intervention(s): Support Pillows;Position options;Skin to skin  LATCH Score: 9  Lactation Tools Discussed/Used     Consult Status Consult Status: Follow-up Date: 12/30/15 Follow-up type: In-patient    Stevan Born [facility] 12/29/2015, 3:54 PM

## 2015-12-29 NOTE — Lactation Note (Signed)
This note was copied from a baby's chart. Lactation Consultation Note: Mother paged for Lactation assistance. I arrived in the patient room and infant was screaming. Mother states infant want latch. Mother's breast are very full. Assist with latching infant. Infant latched on the (L) breast with wide open gape. Observed frequent swallows for 20-25 mins. Mothers breast softened slightly. Mothers breast are still full. A DEBP was sat up . Advised mother to pump after breastfeeding and soften breast then massage and ice breast to try and relieve congestion. Mother will page for lactation assistance.   Patient Name: Girl Fritzi Grinde [id number] Date: 12/29/2015 Reason for consult: Follow-up assessment   Maternal Data    Feeding Feeding Type: Breast Fed  LATCH Score/Interventions Latch: Grasps breast easily, tongue down, lips flanged, rhythmical sucking.  Audible Swallowing: Spontaneous and intermittent Intervention(s): Hand expression  Type of Nipple: Everted at rest and after stimulation  Comfort (Breast/Nipple): Filling, red/small blisters or bruises, mild/mod discomfort  Problem noted: Filling Interventions (Filling): Massage;Firm support;Frequent nursing;Double electric pump  Hold (Positioning): No assistance needed to correctly position infant at breast. Intervention(s): Support Pillows;Position options;Skin to skin  LATCH Score: 9  Lactation Tools Discussed/Used     Consult Status Consult Status: Follow-up Date: 12/30/15 Follow-up type: In-patient    Stevan Born [facility] 12/29/2015, 2:38 PM

## 2016-01-08 DIAGNOSIS — F411 Generalized anxiety disorder: Secondary | ICD-10-CM | POA: Diagnosis not present

## 2016-01-14 DIAGNOSIS — Z8659 Personal history of other mental and behavioral disorders: Secondary | ICD-10-CM | POA: Diagnosis not present

## 2016-01-14 DIAGNOSIS — F53 Puerperal psychosis: Secondary | ICD-10-CM | POA: Diagnosis not present

## 2016-01-14 DIAGNOSIS — F411 Generalized anxiety disorder: Secondary | ICD-10-CM | POA: Diagnosis not present

## 2016-01-24 DIAGNOSIS — F411 Generalized anxiety disorder: Secondary | ICD-10-CM | POA: Diagnosis not present

## 2016-01-30 DIAGNOSIS — F411 Generalized anxiety disorder: Secondary | ICD-10-CM | POA: Diagnosis not present

## 2016-02-14 DIAGNOSIS — F411 Generalized anxiety disorder: Secondary | ICD-10-CM | POA: Diagnosis not present

## 2016-02-19 DIAGNOSIS — G379 Demyelinating disease of central nervous system, unspecified: Secondary | ICD-10-CM | POA: Diagnosis not present

## 2016-03-06 DIAGNOSIS — Z30430 Encounter for insertion of intrauterine contraceptive device: Secondary | ICD-10-CM | POA: Diagnosis not present

## 2016-03-06 DIAGNOSIS — Z3200 Encounter for pregnancy test, result unknown: Secondary | ICD-10-CM | POA: Diagnosis not present

## 2016-06-01 DIAGNOSIS — Z1151 Encounter for screening for human papillomavirus (HPV): Secondary | ICD-10-CM | POA: Diagnosis not present

## 2016-06-01 DIAGNOSIS — Z01419 Encounter for gynecological examination (general) (routine) without abnormal findings: Secondary | ICD-10-CM | POA: Diagnosis not present

## 2016-06-01 DIAGNOSIS — Z6825 Body mass index (BMI) 25.0-25.9, adult: Secondary | ICD-10-CM | POA: Diagnosis not present

## 2016-06-29 DIAGNOSIS — J018 Other acute sinusitis: Secondary | ICD-10-CM | POA: Diagnosis not present

## 2016-06-29 DIAGNOSIS — G379 Demyelinating disease of central nervous system, unspecified: Secondary | ICD-10-CM | POA: Diagnosis not present

## 2016-06-29 DIAGNOSIS — R9082 White matter disease, unspecified: Secondary | ICD-10-CM | POA: Diagnosis not present

## 2016-07-06 DIAGNOSIS — F53 Puerperal psychosis: Secondary | ICD-10-CM | POA: Diagnosis not present

## 2016-07-25 DIAGNOSIS — F411 Generalized anxiety disorder: Secondary | ICD-10-CM | POA: Diagnosis not present

## 2016-07-25 DIAGNOSIS — F321 Major depressive disorder, single episode, moderate: Secondary | ICD-10-CM | POA: Diagnosis not present

## 2016-08-10 DIAGNOSIS — F411 Generalized anxiety disorder: Secondary | ICD-10-CM | POA: Diagnosis not present

## 2016-08-10 DIAGNOSIS — F321 Major depressive disorder, single episode, moderate: Secondary | ICD-10-CM | POA: Diagnosis not present

## 2016-08-24 DIAGNOSIS — F411 Generalized anxiety disorder: Secondary | ICD-10-CM | POA: Diagnosis not present

## 2016-08-24 DIAGNOSIS — F321 Major depressive disorder, single episode, moderate: Secondary | ICD-10-CM | POA: Diagnosis not present

## 2016-09-08 DIAGNOSIS — F321 Major depressive disorder, single episode, moderate: Secondary | ICD-10-CM | POA: Diagnosis not present

## 2016-09-08 DIAGNOSIS — F411 Generalized anxiety disorder: Secondary | ICD-10-CM | POA: Diagnosis not present

## 2016-09-15 DIAGNOSIS — F411 Generalized anxiety disorder: Secondary | ICD-10-CM | POA: Diagnosis not present

## 2016-09-23 DIAGNOSIS — Z30431 Encounter for routine checking of intrauterine contraceptive device: Secondary | ICD-10-CM | POA: Diagnosis not present

## 2016-09-29 DIAGNOSIS — F411 Generalized anxiety disorder: Secondary | ICD-10-CM | POA: Diagnosis not present

## 2016-09-29 DIAGNOSIS — F321 Major depressive disorder, single episode, moderate: Secondary | ICD-10-CM | POA: Diagnosis not present

## 2016-10-15 DIAGNOSIS — F411 Generalized anxiety disorder: Secondary | ICD-10-CM | POA: Diagnosis not present

## 2016-10-15 DIAGNOSIS — F321 Major depressive disorder, single episode, moderate: Secondary | ICD-10-CM | POA: Diagnosis not present

## 2016-11-07 DIAGNOSIS — F321 Major depressive disorder, single episode, moderate: Secondary | ICD-10-CM | POA: Diagnosis not present

## 2016-11-07 DIAGNOSIS — F411 Generalized anxiety disorder: Secondary | ICD-10-CM | POA: Diagnosis not present

## 2016-11-12 DIAGNOSIS — Z23 Encounter for immunization: Secondary | ICD-10-CM | POA: Diagnosis not present

## 2017-04-20 DIAGNOSIS — G379 Demyelinating disease of central nervous system, unspecified: Secondary | ICD-10-CM | POA: Diagnosis not present

## 2017-05-17 DIAGNOSIS — G379 Demyelinating disease of central nervous system, unspecified: Secondary | ICD-10-CM | POA: Diagnosis not present

## 2017-06-17 DIAGNOSIS — Z01419 Encounter for gynecological examination (general) (routine) without abnormal findings: Secondary | ICD-10-CM | POA: Diagnosis not present

## 2017-06-17 DIAGNOSIS — Z6825 Body mass index (BMI) 25.0-25.9, adult: Secondary | ICD-10-CM | POA: Diagnosis not present

## 2017-07-20 DIAGNOSIS — R197 Diarrhea, unspecified: Secondary | ICD-10-CM | POA: Diagnosis not present

## 2017-09-15 DIAGNOSIS — Z30432 Encounter for removal of intrauterine contraceptive device: Secondary | ICD-10-CM | POA: Diagnosis not present

## 2017-09-15 DIAGNOSIS — R102 Pelvic and perineal pain: Secondary | ICD-10-CM | POA: Diagnosis not present

## 2017-09-15 DIAGNOSIS — R109 Unspecified abdominal pain: Secondary | ICD-10-CM | POA: Diagnosis not present

## 2017-09-23 DIAGNOSIS — K635 Polyp of colon: Secondary | ICD-10-CM | POA: Diagnosis not present

## 2017-09-23 DIAGNOSIS — R197 Diarrhea, unspecified: Secondary | ICD-10-CM | POA: Diagnosis not present

## 2017-09-28 DIAGNOSIS — K635 Polyp of colon: Secondary | ICD-10-CM | POA: Diagnosis not present

## 2017-10-04 DIAGNOSIS — K580 Irritable bowel syndrome with diarrhea: Secondary | ICD-10-CM | POA: Diagnosis not present

## 2017-10-21 DIAGNOSIS — G379 Demyelinating disease of central nervous system, unspecified: Secondary | ICD-10-CM | POA: Diagnosis not present

## 2017-10-22 DIAGNOSIS — Z3687 Encounter for antenatal screening for uncertain dates: Secondary | ICD-10-CM | POA: Diagnosis not present

## 2017-10-22 DIAGNOSIS — Z3201 Encounter for pregnancy test, result positive: Secondary | ICD-10-CM | POA: Diagnosis not present

## 2017-10-26 DIAGNOSIS — Z3687 Encounter for antenatal screening for uncertain dates: Secondary | ICD-10-CM | POA: Diagnosis not present

## 2017-10-29 DIAGNOSIS — Z3687 Encounter for antenatal screening for uncertain dates: Secondary | ICD-10-CM | POA: Diagnosis not present

## 2017-11-08 DIAGNOSIS — Z3201 Encounter for pregnancy test, result positive: Secondary | ICD-10-CM | POA: Diagnosis not present

## 2017-12-03 DIAGNOSIS — Z3A10 10 weeks gestation of pregnancy: Secondary | ICD-10-CM | POA: Diagnosis not present

## 2017-12-03 DIAGNOSIS — Z113 Encounter for screening for infections with a predominantly sexual mode of transmission: Secondary | ICD-10-CM | POA: Diagnosis not present

## 2017-12-03 DIAGNOSIS — O09521 Supervision of elderly multigravida, first trimester: Secondary | ICD-10-CM | POA: Diagnosis not present

## 2017-12-03 DIAGNOSIS — Z23 Encounter for immunization: Secondary | ICD-10-CM | POA: Diagnosis not present

## 2017-12-03 DIAGNOSIS — O3680X0 Pregnancy with inconclusive fetal viability, not applicable or unspecified: Secondary | ICD-10-CM | POA: Diagnosis not present

## 2017-12-03 LAB — OB RESULTS CONSOLE GC/CHLAMYDIA: Chlamydia: NEGATIVE

## 2017-12-28 DIAGNOSIS — O3680X0 Pregnancy with inconclusive fetal viability, not applicable or unspecified: Secondary | ICD-10-CM | POA: Diagnosis not present

## 2017-12-28 DIAGNOSIS — Z3A13 13 weeks gestation of pregnancy: Secondary | ICD-10-CM | POA: Diagnosis not present

## 2018-01-03 DIAGNOSIS — R110 Nausea: Secondary | ICD-10-CM | POA: Diagnosis not present

## 2018-01-03 DIAGNOSIS — R293 Abnormal posture: Secondary | ICD-10-CM | POA: Diagnosis not present

## 2018-01-03 DIAGNOSIS — R42 Dizziness and giddiness: Secondary | ICD-10-CM | POA: Diagnosis not present

## 2018-01-03 DIAGNOSIS — M26612 Adhesions and ankylosis of left temporomandibular joint: Secondary | ICD-10-CM | POA: Diagnosis not present

## 2018-01-03 DIAGNOSIS — M9907 Segmental and somatic dysfunction of upper extremity: Secondary | ICD-10-CM | POA: Diagnosis not present

## 2018-01-06 DIAGNOSIS — R110 Nausea: Secondary | ICD-10-CM | POA: Diagnosis not present

## 2018-01-06 DIAGNOSIS — R42 Dizziness and giddiness: Secondary | ICD-10-CM | POA: Diagnosis not present

## 2018-01-06 DIAGNOSIS — R293 Abnormal posture: Secondary | ICD-10-CM | POA: Diagnosis not present

## 2018-01-06 DIAGNOSIS — M26612 Adhesions and ankylosis of left temporomandibular joint: Secondary | ICD-10-CM | POA: Diagnosis not present

## 2018-01-24 DIAGNOSIS — M26612 Adhesions and ankylosis of left temporomandibular joint: Secondary | ICD-10-CM | POA: Diagnosis not present

## 2018-01-24 DIAGNOSIS — R42 Dizziness and giddiness: Secondary | ICD-10-CM | POA: Diagnosis not present

## 2018-01-24 DIAGNOSIS — R293 Abnormal posture: Secondary | ICD-10-CM | POA: Diagnosis not present

## 2018-01-24 DIAGNOSIS — R110 Nausea: Secondary | ICD-10-CM | POA: Diagnosis not present

## 2018-01-28 DIAGNOSIS — Z3A18 18 weeks gestation of pregnancy: Secondary | ICD-10-CM | POA: Diagnosis not present

## 2018-01-28 DIAGNOSIS — O09522 Supervision of elderly multigravida, second trimester: Secondary | ICD-10-CM | POA: Diagnosis not present

## 2018-01-31 DIAGNOSIS — M26612 Adhesions and ankylosis of left temporomandibular joint: Secondary | ICD-10-CM | POA: Diagnosis not present

## 2018-01-31 DIAGNOSIS — R42 Dizziness and giddiness: Secondary | ICD-10-CM | POA: Diagnosis not present

## 2018-01-31 DIAGNOSIS — R110 Nausea: Secondary | ICD-10-CM | POA: Diagnosis not present

## 2018-01-31 DIAGNOSIS — R293 Abnormal posture: Secondary | ICD-10-CM | POA: Diagnosis not present

## 2018-02-04 DIAGNOSIS — O26899 Other specified pregnancy related conditions, unspecified trimester: Secondary | ICD-10-CM | POA: Diagnosis not present

## 2018-02-04 DIAGNOSIS — Z3A19 19 weeks gestation of pregnancy: Secondary | ICD-10-CM | POA: Diagnosis not present

## 2018-02-04 DIAGNOSIS — O09522 Supervision of elderly multigravida, second trimester: Secondary | ICD-10-CM | POA: Diagnosis not present

## 2018-02-16 NOTE — L&D Delivery Note (Signed)
Delivery Note At 12:17 AM a viable female was delivered via Vaginal, Spontaneous (Presentation: LOA with compound presention- both arms up;  ).  APGAR: per nursing notes- no respiration, no color or tone after stimulation and at 30 seconds cords clamped and cut and baby to warmer for nursing care, ; weight  pending.   Placenta status: spontaneous, intact, .  Cord:  3VCwith the following complications: chorioamnionitis, PPH.  Cord pH: 7.18  Team prepped for possible shoulder dystocia given prolonged pushing for this multiparous pt. No shoulder dystocia noted, instead compound presentation. Placenta about 5 minutes after delivery. Aggressive bleeding noted, pitocin infusing, methergine IM given, 1000 mcg rectal cytotec given, IV Lysteeda given. Bimanual compression continued. Fever 102.5 noted, gent/clinda ordered. Repeat vaginal and cervical exam w/o lacerations, placenta re-evaluated and complete, foley removed at crowning.  Bleeding slowed after about 15 minutes. EBL 1200.   Anesthesia: epidural  Episiotomy:  none Lacerations:  none Suture Repair: n/a Est. Blood Loss (mL):  1200 my EBL (final QBL at 1208)  Mom to postpartum.  Baby to Couplet care / Skin to Skin.  Lendon Colonel 07/04/2018, 12:47 AM

## 2018-02-23 DIAGNOSIS — Z3482 Encounter for supervision of other normal pregnancy, second trimester: Secondary | ICD-10-CM | POA: Diagnosis not present

## 2018-03-21 DIAGNOSIS — Z3A25 25 weeks gestation of pregnancy: Secondary | ICD-10-CM | POA: Diagnosis not present

## 2018-03-21 DIAGNOSIS — O09522 Supervision of elderly multigravida, second trimester: Secondary | ICD-10-CM | POA: Diagnosis not present

## 2018-04-06 DIAGNOSIS — Z3A28 28 weeks gestation of pregnancy: Secondary | ICD-10-CM | POA: Diagnosis not present

## 2018-04-06 DIAGNOSIS — O09522 Supervision of elderly multigravida, second trimester: Secondary | ICD-10-CM | POA: Diagnosis not present

## 2018-04-06 DIAGNOSIS — Z3689 Encounter for other specified antenatal screening: Secondary | ICD-10-CM | POA: Diagnosis not present

## 2018-04-07 DIAGNOSIS — D224 Melanocytic nevi of scalp and neck: Secondary | ICD-10-CM | POA: Diagnosis not present

## 2018-04-07 DIAGNOSIS — D2221 Melanocytic nevi of right ear and external auricular canal: Secondary | ICD-10-CM | POA: Diagnosis not present

## 2018-04-07 DIAGNOSIS — D2262 Melanocytic nevi of left upper limb, including shoulder: Secondary | ICD-10-CM | POA: Diagnosis not present

## 2018-04-07 DIAGNOSIS — D2261 Melanocytic nevi of right upper limb, including shoulder: Secondary | ICD-10-CM | POA: Diagnosis not present

## 2018-04-07 DIAGNOSIS — D485 Neoplasm of uncertain behavior of skin: Secondary | ICD-10-CM | POA: Diagnosis not present

## 2018-04-08 DIAGNOSIS — D224 Melanocytic nevi of scalp and neck: Secondary | ICD-10-CM | POA: Diagnosis not present

## 2018-04-08 DIAGNOSIS — D2272 Melanocytic nevi of left lower limb, including hip: Secondary | ICD-10-CM | POA: Diagnosis not present

## 2018-04-27 DIAGNOSIS — Z3A31 31 weeks gestation of pregnancy: Secondary | ICD-10-CM | POA: Diagnosis not present

## 2018-04-27 DIAGNOSIS — O09523 Supervision of elderly multigravida, third trimester: Secondary | ICD-10-CM | POA: Diagnosis not present

## 2018-05-10 DIAGNOSIS — Z3A32 32 weeks gestation of pregnancy: Secondary | ICD-10-CM | POA: Diagnosis not present

## 2018-05-10 DIAGNOSIS — O09523 Supervision of elderly multigravida, third trimester: Secondary | ICD-10-CM | POA: Diagnosis not present

## 2018-05-25 DIAGNOSIS — Z3A35 35 weeks gestation of pregnancy: Secondary | ICD-10-CM | POA: Diagnosis not present

## 2018-05-25 DIAGNOSIS — Z23 Encounter for immunization: Secondary | ICD-10-CM | POA: Diagnosis not present

## 2018-05-25 DIAGNOSIS — O09523 Supervision of elderly multigravida, third trimester: Secondary | ICD-10-CM | POA: Diagnosis not present

## 2018-05-26 ENCOUNTER — Other Ambulatory Visit: Payer: Self-pay

## 2018-05-26 ENCOUNTER — Inpatient Hospital Stay (HOSPITAL_COMMUNITY)
Admission: AD | Admit: 2018-05-26 | Discharge: 2018-05-26 | Disposition: A | Discharge status: Home / Self Care | Payer: BLUE CROSS/BLUE SHIELD | Attending: Obstetrics and Gynecology | Admitting: Obstetrics and Gynecology

## 2018-05-26 ENCOUNTER — Encounter (HOSPITAL_COMMUNITY): Payer: Self-pay | Admitting: *Deleted

## 2018-05-26 DIAGNOSIS — Z3A35 35 weeks gestation of pregnancy: Secondary | ICD-10-CM | POA: Diagnosis not present

## 2018-05-26 DIAGNOSIS — F329 Major depressive disorder, single episode, unspecified: Secondary | ICD-10-CM | POA: Diagnosis not present

## 2018-05-26 DIAGNOSIS — O99343 Other mental disorders complicating pregnancy, third trimester: Secondary | ICD-10-CM | POA: Insufficient documentation

## 2018-05-26 DIAGNOSIS — G35 Multiple sclerosis: Secondary | ICD-10-CM | POA: Diagnosis not present

## 2018-05-26 DIAGNOSIS — O4703 False labor before 37 completed weeks of gestation, third trimester: Secondary | ICD-10-CM

## 2018-05-26 DIAGNOSIS — R109 Unspecified abdominal pain: Secondary | ICD-10-CM | POA: Diagnosis not present

## 2018-05-26 DIAGNOSIS — F419 Anxiety disorder, unspecified: Secondary | ICD-10-CM | POA: Insufficient documentation

## 2018-05-26 DIAGNOSIS — O99355 Diseases of the nervous system complicating the puerperium: Secondary | ICD-10-CM | POA: Insufficient documentation

## 2018-05-26 DIAGNOSIS — Z79899 Other long term (current) drug therapy: Secondary | ICD-10-CM | POA: Diagnosis not present

## 2018-05-26 HISTORY — DX: Multiple sclerosis: G35

## 2018-05-26 LAB — URINALYSIS, ROUTINE W REFLEX MICROSCOPIC
Bilirubin Urine: NEGATIVE
Glucose, UA: NEGATIVE mg/dL
Hgb urine dipstick: NEGATIVE
Ketones, ur: 20 mg/dL — AB
Leukocytes,Ua: NEGATIVE
Nitrite: NEGATIVE
Protein, ur: NEGATIVE mg/dL
Specific Gravity, Urine: 1.005 (ref 1.005–1.030)
pH: 6 (ref 5.0–8.0)

## 2018-05-26 NOTE — Discharge Instructions (Signed)

## 2018-05-26 NOTE — MAU Provider Note (Signed)
Chief Complaint:  Abdominal Pain   First Provider Initiated Contact with Patient 05/26/18 [phone]     HPI: Crystal Wilkerson is a 38 y.o. G2P1001 at [redacted]w[redacted]d who presents to maternity admissions reporting abdominal cramping. Symptoms started last night. States her toddler jumped on her abdomen last night around 7 pm. Had some abdominal discomfort after that, spoke with her midwife & was told to come in for worsening symptoms.  Reports worsening cramping this morning that is "back to back". Denies LOF or vaginal bleeding. Normal fetal movement. No GI or urinary symptoms.   Location: abdomen Quality: cramping Severity: 6/10 in pain scale Duration: <1 day Timing: intermittent Modifying factors: none Associated signs and symptoms: none  Past Medical History:  Diagnosis Date  . Anemia   . Anxiety   . Asthma    mild on exertion  . Depression    hx of PPD, doing well  . Interstitial cystitis   . Maternal iron deficiency anemia affecting pregnancy in third trimester, antepartum 12/26/2015  . Multiple sclerosis (HCC)   . Postpartum care following vaginal delivery (11/10) 12/27/2015  . UTI (lower urinary tract infection)    OB History  Gravida Para Term Preterm AB Living  2 1 1     1   SAB TAB Ectopic Multiple Live Births        0 1    # Outcome Date GA Lbr Len/2nd Weight Sex Delivery Anes PTL Lv  2 Current           1 Term 12/27/15 [redacted]w[redacted]d 06:00 / 06:10 3402 g F Vag-Spont EPI  LIV     Birth Comments: WNL   Past Surgical History:  Procedure Laterality Date  . bladder enlarged     2006   Family History  Problem Relation Age of Onset  . Breast cancer Paternal Grandmother   . Heart failure Paternal Grandmother   . Heart disease Maternal Grandfather        all grandparents  . Diabetes Maternal Grandmother   . Cancer Mother        cervical  . Diabetes Mother   . Hypertension Father    Social History   Tobacco Use  . Smoking status: Never Smoker  . Smokeless tobacco: Never Used   Substance Use Topics  . Alcohol use: Not Currently    Alcohol/week: 1.0 standard drinks    Types: 1 Cans of beer per week    Comment: occ.  . Drug use: No   No Known Allergies Medications Prior to Admission  Medication Sig Dispense Refill Last Dose  . buPROPion (WELLBUTRIN) 100 MG tablet Take 150 mg by mouth 1 day or 1 dose.   05/25/2018 at Unknown time  . famotidine (PEPCID) 20 MG tablet Take 20 mg by mouth 2 (two) times daily.   05/25/2018 at Unknown time  . LYSINE ACETATE PO Take 1 tablet by mouth daily.   05/25/2018 at Unknown time  . Prenatal Vit-Fe Fumarate-FA (PRENATAL MULTIVITAMIN) TABS tablet Take 1 tablet by mouth daily at 12 noon.   05/25/2018 at Unknown time  . ferrous sulfate 325 (65 FE) MG tablet Take 1 tablet (325 mg total) by mouth daily with breakfast. 30 tablet 0 Past Week at Unknown time  . folic acid (FOLVITE) 400 MCG tablet Take 400 mcg by mouth daily.   Past Week at Unknown time  . ibuprofen (ADVIL,MOTRIN) 600 MG tablet Take 1 tablet (600 mg total) by mouth every 6 (six) hours. 30 tablet 0 More than a  month at Unknown time  . OVER THE COUNTER MEDICATION Take 2 tablets by mouth 3 (three) times daily. Pt takes a medication called Wholezymes. (For digestion)   Past Week at Unknown time    I have reviewed patient's Past Medical Hx, Surgical Hx, Family Hx, Social Hx, medications and allergies.   ROS:  Review of Systems  Constitutional: Negative.   Gastrointestinal: Positive for abdominal pain. Negative for constipation, diarrhea, nausea and vomiting.  Genitourinary: Negative.     Physical Exam   Patient Vitals for the past 24 hrs:  BP Temp Temp src Pulse Resp SpO2 Weight  05/26/18 0825 (!) 100/45 - - (!) 111 - 95 % -  05/26/18 0812 (!) 85/44 98.6 F (37 C) Oral (!) 113 18 97 % 89.4 kg    Constitutional: Well-developed, well-nourished female in no acute distress.  Cardiovascular: normal rate & rhythm, no murmur Respiratory: normal effort, lung sounds clear  throughout GI: Abd soft, non-tender, gravid appropriate for gestational age. Pos BS x 4. Ctx palpate mild with adequate rest MS: Extremities nontender, no edema, normal ROM Neurologic: Alert and oriented x 4.  GU: Dilation: Closed(ext fingertip) Effacement (%): Thick Cervical Position: Posterior Station: (high) Exam by:: Judeth Horn NP  NST:  Baseline: 140 bpm, Variability: Good {> 6 bpm), Accelerations: Reactive and Decelerations: Absent   Labs: Results for orders placed or performed during the hospital encounter of 05/26/18 (from the past 24 hour(s))  Urinalysis, Routine w reflex microscopic     Status: Abnormal   Collection Time: 05/26/18  8:41 AM  Result Value Ref Range   Color, Urine YELLOW YELLOW   APPearance HAZY (A) CLEAR   Specific Gravity, Urine 1.005 1.005 - 1.030   pH 6.0 5.0 - 8.0   Glucose, UA NEGATIVE NEGATIVE mg/dL   Hgb urine dipstick NEGATIVE NEGATIVE   Bilirubin Urine NEGATIVE NEGATIVE   Ketones, ur 20 (A) NEGATIVE mg/dL   Protein, ur NEGATIVE NEGATIVE mg/dL   Nitrite NEGATIVE NEGATIVE   Leukocytes,Ua NEGATIVE NEGATIVE    Imaging:  No results found.  MAU Course: Orders Placed This Encounter  Procedures  . Urinalysis, Routine w reflex microscopic  . Discharge patient   No orders of the defined types were placed in this encounter.   MDM: Reactive fetal tracing & irregular UI on monitor. Abdomen soft & non tender. Occasional mild contraction palpated.  U/a negative for infection Cervix unchanged after 1+ hour of monitoring  Assessment: 1. Preterm uterine contractions in third trimester, antepartum   2. [redacted] weeks gestation of pregnancy     Plan: Discharge home in stable condition.  Preterm Labor precautions and fetal kick counts Encouraged to contact her midwife if symptoms don't improve later this afternoon. Should return to MAU for worsening symptoms, LOF, vaginal bleeding, or DFM.    Follow-up Information    [facility] Follow up.    Why:  Call midwife if symptoms don't improve.  Return to MAU for worsening symptoms, vaginal bleeding, leaking of fluid, or decreased fetal movement.  Contact information: [address] [place] [address] [phone]           Allergies as of 05/26/2018   No Known Allergies     Medication List    STOP taking these medications   ferrous sulfate 325 (65 FE) MG tablet   folic acid 400 MCG tablet Commonly known as:  FOLVITE   ibuprofen 600 MG tablet Commonly known as:  ADVIL,MOTRIN   OVER THE COUNTER MEDICATION  TAKE these medications   buPROPion 100 MG tablet Commonly known as:  WELLBUTRIN Take 150 mg by mouth 1 day or 1 dose.   famotidine 20 MG tablet Commonly known as:  PEPCID Take 20 mg by mouth 2 (two) times daily.   LYSINE ACETATE PO Take 1 tablet by mouth daily.   prenatal multivitamin Tabs tablet Take 1 tablet by mouth daily at 12 noon.       Judeth HornLawrence, Andria Head, NP 05/26/2018 10:01 AM

## 2018-05-26 NOTE — MAU Note (Signed)
Toddler jump on abd last night, started cramping, continued through the night.   No bleeding, or leaking.  Now feeling cramping in lower abd an low back, pelvic pressure.  Reports +FM.

## 2018-05-31 DIAGNOSIS — Z3685 Encounter for antenatal screening for Streptococcus B: Secondary | ICD-10-CM | POA: Diagnosis not present

## 2018-05-31 DIAGNOSIS — Z3A35 35 weeks gestation of pregnancy: Secondary | ICD-10-CM | POA: Diagnosis not present

## 2018-05-31 DIAGNOSIS — O09523 Supervision of elderly multigravida, third trimester: Secondary | ICD-10-CM | POA: Diagnosis not present

## 2018-06-14 DIAGNOSIS — O09523 Supervision of elderly multigravida, third trimester: Secondary | ICD-10-CM | POA: Diagnosis not present

## 2018-06-14 DIAGNOSIS — Z3A37 37 weeks gestation of pregnancy: Secondary | ICD-10-CM | POA: Diagnosis not present

## 2018-06-14 DIAGNOSIS — O26893 Other specified pregnancy related conditions, third trimester: Secondary | ICD-10-CM | POA: Diagnosis not present

## 2018-06-20 DIAGNOSIS — O26893 Other specified pregnancy related conditions, third trimester: Secondary | ICD-10-CM | POA: Diagnosis not present

## 2018-06-20 DIAGNOSIS — O09523 Supervision of elderly multigravida, third trimester: Secondary | ICD-10-CM | POA: Diagnosis not present

## 2018-06-20 DIAGNOSIS — Z3A38 38 weeks gestation of pregnancy: Secondary | ICD-10-CM | POA: Diagnosis not present

## 2018-06-23 DIAGNOSIS — L710 Perioral dermatitis: Secondary | ICD-10-CM | POA: Diagnosis not present

## 2018-06-23 DIAGNOSIS — L300 Nummular dermatitis: Secondary | ICD-10-CM | POA: Diagnosis not present

## 2018-06-29 DIAGNOSIS — Z3A40 40 weeks gestation of pregnancy: Secondary | ICD-10-CM | POA: Diagnosis not present

## 2018-06-29 DIAGNOSIS — O09523 Supervision of elderly multigravida, third trimester: Secondary | ICD-10-CM | POA: Diagnosis not present

## 2018-07-03 ENCOUNTER — Inpatient Hospital Stay (HOSPITAL_COMMUNITY): Payer: BLUE CROSS/BLUE SHIELD | Admitting: Anesthesiology

## 2018-07-03 ENCOUNTER — Encounter (HOSPITAL_COMMUNITY): Payer: Self-pay | Admitting: *Deleted

## 2018-07-03 ENCOUNTER — Other Ambulatory Visit: Payer: Self-pay

## 2018-07-03 ENCOUNTER — Inpatient Hospital Stay (HOSPITAL_COMMUNITY)
Admission: RE | Admit: 2018-07-03 | Discharge: 2018-07-05 | DRG: 806 | Disposition: A | Discharge status: Home / Self Care | Payer: BLUE CROSS/BLUE SHIELD | Attending: Obstetrics | Admitting: Obstetrics

## 2018-07-03 DIAGNOSIS — O99824 Streptococcus B carrier state complicating childbirth: Secondary | ICD-10-CM | POA: Diagnosis present

## 2018-07-03 DIAGNOSIS — J45909 Unspecified asthma, uncomplicated: Secondary | ICD-10-CM | POA: Diagnosis not present

## 2018-07-03 DIAGNOSIS — O326XX0 Maternal care for compound presentation, not applicable or unspecified: Secondary | ICD-10-CM | POA: Diagnosis not present

## 2018-07-03 DIAGNOSIS — O99344 Other mental disorders complicating childbirth: Secondary | ICD-10-CM | POA: Diagnosis present

## 2018-07-03 DIAGNOSIS — Z3A40 40 weeks gestation of pregnancy: Secondary | ICD-10-CM

## 2018-07-03 DIAGNOSIS — O26893 Other specified pregnancy related conditions, third trimester: Secondary | ICD-10-CM | POA: Diagnosis present

## 2018-07-03 DIAGNOSIS — G35 Multiple sclerosis: Secondary | ICD-10-CM | POA: Diagnosis not present

## 2018-07-03 DIAGNOSIS — Z6791 Unspecified blood type, Rh negative: Secondary | ICD-10-CM | POA: Diagnosis not present

## 2018-07-03 DIAGNOSIS — O411230 Chorioamnionitis, third trimester, not applicable or unspecified: Secondary | ICD-10-CM | POA: Diagnosis not present

## 2018-07-03 DIAGNOSIS — D72829 Elevated white blood cell count, unspecified: Secondary | ICD-10-CM | POA: Diagnosis not present

## 2018-07-03 DIAGNOSIS — F419 Anxiety disorder, unspecified: Secondary | ICD-10-CM | POA: Diagnosis not present

## 2018-07-03 DIAGNOSIS — O9952 Diseases of the respiratory system complicating childbirth: Secondary | ICD-10-CM | POA: Diagnosis not present

## 2018-07-03 DIAGNOSIS — O99354 Diseases of the nervous system complicating childbirth: Secondary | ICD-10-CM | POA: Diagnosis not present

## 2018-07-03 DIAGNOSIS — Z1159 Encounter for screening for other viral diseases: Secondary | ICD-10-CM

## 2018-07-03 DIAGNOSIS — Z23 Encounter for immunization: Secondary | ICD-10-CM | POA: Diagnosis not present

## 2018-07-03 LAB — TYPE AND SCREEN
ABO/RH(D): A NEG
Antibody Screen: NEGATIVE

## 2018-07-03 LAB — CBC
HCT: 37.1 % (ref 36.0–46.0)
Hemoglobin: 12.3 g/dL (ref 12.0–15.0)
MCH: 31.1 pg (ref 26.0–34.0)
MCHC: 33.2 g/dL (ref 30.0–36.0)
MCV: 93.7 fL (ref 80.0–100.0)
Platelets: 240 10*3/uL (ref 150–400)
RBC: 3.96 MIL/uL (ref 3.87–5.11)
RDW: 14.1 % (ref 11.5–15.5)
WBC: 18.2 10*3/uL — ABNORMAL HIGH (ref 4.0–10.5)
nRBC: 0 % (ref 0.0–0.2)

## 2018-07-03 MED ORDER — FLEET ENEMA 7-19 GM/118ML RE ENEM: 1.0000 | ENEMA | RECTAL | Status: DC | PRN

## 2018-07-03 MED ORDER — LACTATED RINGERS IV SOLN
INTRAVENOUS | Status: DC
  Administered 2018-07-03 (×2): via INTRAVENOUS

## 2018-07-03 MED ORDER — LACTATED RINGERS IV SOLN
500.0000 mL | Freq: Once | INTRAVENOUS | Status: AC
  Administered 2018-07-03: 500 mL via INTRAVENOUS

## 2018-07-03 MED ORDER — EPHEDRINE 5 MG/ML INJ: 10.0000 mg | INTRAVENOUS | Status: DC | PRN

## 2018-07-03 MED ORDER — OXYTOCIN 40 UNITS IN NORMAL SALINE INFUSION - SIMPLE MED
2.5000 [IU]/h | INTRAVENOUS | Status: DC
  Filled 2018-07-03: qty 1000

## 2018-07-03 MED ORDER — LIDOCAINE HCL (PF) 1 % IJ SOLN
INTRAMUSCULAR | Status: DC | PRN
  Administered 2018-07-03: 5 mL via EPIDURAL

## 2018-07-03 MED ORDER — SODIUM CHLORIDE (PF) 0.9 % IJ SOLN
INTRAMUSCULAR | Status: DC | PRN
  Administered 2018-07-03: 14 mL/h via EPIDURAL

## 2018-07-03 MED ORDER — LIDOCAINE HCL (PF) 1 % IJ SOLN: 30.0000 mL | INTRAMUSCULAR | Status: DC | PRN

## 2018-07-03 MED ORDER — LACTATED RINGERS IV SOLN: 500.0000 mL | Freq: Once | INTRAVENOUS | Status: DC

## 2018-07-03 MED ORDER — OXYTOCIN BOLUS FROM INFUSION
500.0000 mL | Freq: Once | INTRAVENOUS | Status: AC
  Administered 2018-07-04: 500 mL via INTRAVENOUS

## 2018-07-03 MED ORDER — FENTANYL-BUPIVACAINE-NACL 0.5-0.125-0.9 MG/250ML-% EP SOLN: 12.0000 mL/h | EPIDURAL | Status: DC | PRN

## 2018-07-03 MED ORDER — SODIUM CHLORIDE 0.9 % IV SOLN
2.0000 g | Freq: Once | INTRAVENOUS | Status: AC
  Administered 2018-07-03: 2 g via INTRAVENOUS
  Filled 2018-07-03: qty 2000

## 2018-07-03 MED ORDER — LACTATED RINGERS IV SOLN: 500.0000 mL | INTRAVENOUS | Status: DC | PRN

## 2018-07-03 MED ORDER — ACETAMINOPHEN 325 MG PO TABS: 650.0000 mg | ORAL_TABLET | ORAL | Status: DC | PRN

## 2018-07-03 MED ORDER — FENTANYL-BUPIVACAINE-NACL 0.5-0.125-0.9 MG/250ML-% EP SOLN
12.0000 mL/h | EPIDURAL | Status: DC | PRN
  Filled 2018-07-03: qty 250

## 2018-07-03 MED ORDER — OXYCODONE-ACETAMINOPHEN 5-325 MG PO TABS: 2.0000 | ORAL_TABLET | ORAL | Status: DC | PRN

## 2018-07-03 MED ORDER — DIPHENHYDRAMINE HCL 50 MG/ML IJ SOLN: 12.5000 mg | INTRAMUSCULAR | Status: DC | PRN

## 2018-07-03 MED ORDER — PHENYLEPHRINE 40 MCG/ML (10ML) SYRINGE FOR IV PUSH (FOR BLOOD PRESSURE SUPPORT): 80.0000 ug | PREFILLED_SYRINGE | INTRAVENOUS | Status: DC | PRN

## 2018-07-03 MED ORDER — OXYCODONE-ACETAMINOPHEN 5-325 MG PO TABS: 1.0000 | ORAL_TABLET | ORAL | Status: DC | PRN

## 2018-07-03 MED ORDER — ONDANSETRON HCL 4 MG/2ML IJ SOLN
4.0000 mg | Freq: Four times a day (QID) | INTRAMUSCULAR | Status: DC | PRN
  Administered 2018-07-04: 4 mg via INTRAVENOUS
  Filled 2018-07-03: qty 2

## 2018-07-03 MED ORDER — SOD CITRATE-CITRIC ACID 500-334 MG/5ML PO SOLN: 30.0000 mL | ORAL | Status: DC | PRN

## 2018-07-03 NOTE — Anesthesia Preprocedure Evaluation (Signed)
Anesthesia Evaluation  Patient identified by MRN, date of birth, ID band Patient awake    Reviewed: Allergy & Precautions, Patient's Chart, lab work & pertinent test results  Airway Mallampati: I       Dental no notable dental hx.    Pulmonary asthma ,    Pulmonary exam normal        Cardiovascular Normal cardiovascular exam     Neuro/Psych Anxiety Depression    GI/Hepatic Neg liver ROS, GERD  Medicated,  Endo/Other    Renal/GU negative Renal ROS     Musculoskeletal   Abdominal   Peds  Hematology   Anesthesia Other Findings   Reproductive/Obstetrics (+) Pregnancy                             Anesthesia Physical Anesthesia Plan  ASA: II  Anesthesia Plan: Epidural   Post-op Pain Management:    Induction:   PONV Risk Score and Plan:   Airway Management Planned:   Additional Equipment: None  Intra-op Plan:   Post-operative Plan:   Informed Consent: I have reviewed the patients History and Physical, chart, labs and discussed the procedure including the risks, benefits and alternatives for the proposed anesthesia with the patient or authorized representative who has indicated his/her understanding and acceptance.       Plan Discussed with:   Anesthesia Plan Comments: (Lab Results      Component                Value               Date                      WBC                      18.2 (H)            07/03/2018                HGB                      12.3                07/03/2018                HCT                      37.1                07/03/2018                MCV                      93.7                07/03/2018                PLT                      240                 07/03/2018           )        Anesthesia Quick Evaluation

## 2018-07-03 NOTE — H&P (Signed)
Crystal Wilkerson is a 38 y.o. G2P1001 at [redacted]w[redacted]d presenting for active labor. Pt notes onset contractions this afternoon, regular every 2 min by 630 pm . Good fetal movement, No vaginal bleeding, not leaking fluid.  P[facility] at [facility] since 10 wks - Dated by 1st trimester u/s, unsure LMP, rapid conception after IUD removed - MS, in remission - anxiety, stable on wellbutrin   Prenatal Transfer Tool  Maternal Diabetes: No Genetic Screening: Normal Maternal Ultrasounds/Referrals: Normal Fetal Ultrasounds or other Referrals:  None Maternal Substance Abuse:  No Significant Maternal Medications:  None Significant Maternal Lab Results: None     OB History    Gravida  2   Para  1   Term  1   Preterm      AB      Living  1     SAB      TAB      Ectopic      Multiple  0   Live Births  1          Past Medical History:  Diagnosis Date  . Anemia   . Anxiety   . Asthma    mild on exertion  . Depression    hx of PPD, doing well  . Interstitial cystitis   . Maternal iron deficiency anemia affecting pregnancy in third trimester, antepartum 12/26/2015  . Multiple sclerosis (HCC)   . Postpartum care following vaginal delivery (11/10) 12/27/2015  . UTI (lower urinary tract infection)    Past Surgical History:  Procedure Laterality Date  . bladder enlarged     2006  . COLONOSCOPY     Family History: family history includes Breast cancer in her paternal grandmother; Cancer in her mother; Diabetes in her maternal grandmother and mother; Heart disease in her maternal grandfather; Heart failure in her paternal grandmother; Hypertension in her father. Social History:  reports that she has never smoked. She has never used smokeless tobacco. She reports previous alcohol use of about 1.0 standard drinks of alcohol per week. She reports that she does not use drugs.  Review of Systems - Negative except Contractions   Dilation: 4.5 Effacement (%): 80, 90 Station:  -2 Exam by:: danielle simpson rn  Blood pressure 113/61, pulse 98, temperature 97.7 F (36.5 C), temperature source Oral, resp. rate 19, height 5\' 10"  (1.778 m), weight 93 kg, unknown if currently breastfeeding.  Physical Exam:  Toco: q 2 min FH: baseline 165s, rare accelerations present, no deceleratons, 10 beat variability  Prenatal labs: ABO, Rh: --/--/PENDING (05/17 2015) A neg Antibody: PENDING (05/17 2015) Rubella:  immune RPR:   NR HBsAg:   neg HIV:   neg  GBS:   pos 1 hr Glucola 105  Genetic screening nl Panorama Anatomy US normal   Assessment/Plan: 38 y.o. G2P1001 at [redacted]w[redacted]d Active labor, admit, expectant management Fetal well being, HR high but adequate variability, will watch closely after maternal pain control and hydration GBS pos- ampicillin  plan epidural Anxiety- watch PP - h/o MS  Lendon Colonel 07/03/2018 8:59 PM     Lendon Colonel 07/03/2018, 8:52 PM

## 2018-07-03 NOTE — Anesthesia Procedure Notes (Signed)
Epidural Patient location during procedure: OB Start time: 07/03/2018 9:21 PM End time: 07/03/2018 9:27 PM  Staffing Anesthesiologist: Shelton Silvas, MD Performed: anesthesiologist   Preanesthetic Checklist Completed: patient identified, site marked, surgical consent, pre-op evaluation, timeout performed, IV checked, risks and benefits discussed and monitors and equipment checked  Epidural Patient position: sitting Prep: ChloraPrep Patient monitoring: heart rate, continuous pulse ox and blood pressure Approach: midline Location: L3-L4 Injection technique: LOR saline  Needle:  Needle type: Tuohy  Needle gauge: 17 G Needle length: 9 cm Catheter type: closed end flexible Catheter size: 20 Guage Test dose: negative and 1.5% lidocaine  Assessment Events: blood not aspirated, injection not painful, no injection resistance and no paresthesia  Additional Notes LOR @ 4.5  Patient identified. Risks/Benefits/Options discussed with patient including but not limited to bleeding, infection, nerve damage, paralysis, failed block, incomplete pain control, headache, blood pressure changes, nausea, vomiting, reactions to medications, itching and postpartum back pain. Confirmed with bedside nurse the patient's most recent platelet count. Confirmed with patient that they are not currently taking any anticoagulation, have any bleeding history or any family history of bleeding disorders. Patient expressed understanding and wished to proceed. All questions were answered. Sterile technique was used throughout the entire procedure. Please see nursing notes for vital signs. Test dose was given through epidural catheter and negative prior to continuing to dose epidural or start infusion. Warning signs of high block given to the patient including shortness of breath, tingling/numbness in hands, complete motor block, or any concerning symptoms with instructions to call for help. Patient was given instructions  on fall risk and not to get out of bed. All questions and concerns addressed with instructions to call with any issues or inadequate analgesia.    Reason for block:procedure for pain

## 2018-07-04 ENCOUNTER — Encounter (HOSPITAL_COMMUNITY): Payer: Self-pay

## 2018-07-04 DIAGNOSIS — O411230 Chorioamnionitis, third trimester, not applicable or unspecified: Secondary | ICD-10-CM | POA: Diagnosis not present

## 2018-07-04 LAB — CBC
HCT: 32.6 % — ABNORMAL LOW (ref 36.0–46.0)
Hemoglobin: 11.1 g/dL — ABNORMAL LOW (ref 12.0–15.0)
MCH: 31.3 pg (ref 26.0–34.0)
MCHC: 34 g/dL (ref 30.0–36.0)
MCV: 91.8 fL (ref 80.0–100.0)
Platelets: 223 10*3/uL (ref 150–400)
RBC: 3.55 MIL/uL — ABNORMAL LOW (ref 3.87–5.11)
RDW: 14.1 % (ref 11.5–15.5)
WBC: 32.2 10*3/uL — ABNORMAL HIGH (ref 4.0–10.5)
nRBC: 0 % (ref 0.0–0.2)

## 2018-07-04 LAB — SARS CORONAVIRUS 2 BY RT PCR (HOSPITAL ORDER, PERFORMED IN ~~LOC~~ HOSPITAL LAB): SARS Coronavirus 2: NEGATIVE

## 2018-07-04 LAB — RPR: RPR Ser Ql: NONREACTIVE

## 2018-07-04 LAB — ABO/RH: ABO/RH(D): A NEG

## 2018-07-04 MED ORDER — FAMOTIDINE 20 MG PO TABS
20.0000 mg | ORAL_TABLET | Freq: Two times a day (BID) | ORAL | Status: DC
  Administered 2018-07-04: 20 mg via ORAL
  Filled 2018-07-04: qty 1

## 2018-07-04 MED ORDER — GENTAMICIN SULFATE 40 MG/ML IJ SOLN
5.0000 mg/kg | Freq: Once | INTRAVENOUS | Status: DC
  Filled 2018-07-04: qty 9.75

## 2018-07-04 MED ORDER — METHYLERGONOVINE MALEATE 0.2 MG/ML IJ SOLN
INTRAMUSCULAR | Status: AC
  Administered 2018-07-04: 0.2 mg via INTRAMUSCULAR
  Filled 2018-07-04: qty 1

## 2018-07-04 MED ORDER — PANTOPRAZOLE SODIUM 40 MG IV SOLR
40.0000 mg | Freq: Once | INTRAVENOUS | Status: AC
  Administered 2018-07-04: 40 mg via INTRAVENOUS
  Filled 2018-07-04 (×3): qty 40

## 2018-07-04 MED ORDER — SENNOSIDES-DOCUSATE SODIUM 8.6-50 MG PO TABS
2.0000 | ORAL_TABLET | ORAL | Status: DC
  Administered 2018-07-04: 2 via ORAL
  Filled 2018-07-04: qty 2

## 2018-07-04 MED ORDER — DIPHENHYDRAMINE HCL 25 MG PO CAPS: 25.0000 mg | ORAL_CAPSULE | Freq: Four times a day (QID) | ORAL | Status: DC | PRN

## 2018-07-04 MED ORDER — ZOLPIDEM TARTRATE 5 MG PO TABS: 5.0000 mg | ORAL_TABLET | Freq: Every evening | ORAL | Status: DC | PRN

## 2018-07-04 MED ORDER — OXYCODONE HCL 5 MG PO TABS: 5.0000 mg | ORAL_TABLET | ORAL | Status: DC | PRN

## 2018-07-04 MED ORDER — CLINDAMYCIN PHOSPHATE 900 MG/50ML IV SOLN
900.0000 mg | Freq: Three times a day (TID) | INTRAVENOUS | Status: AC
  Administered 2018-07-04 (×2): 900 mg via INTRAVENOUS
  Filled 2018-07-04 (×2): qty 50

## 2018-07-04 MED ORDER — LACTATED RINGERS IV BOLUS: 1000.0000 mL | Freq: Once | INTRAVENOUS | Status: DC

## 2018-07-04 MED ORDER — ONDANSETRON HCL 4 MG/2ML IJ SOLN: 4.0000 mg | INTRAMUSCULAR | Status: DC | PRN

## 2018-07-04 MED ORDER — ONDANSETRON HCL 4 MG PO TABS: 4.0000 mg | ORAL_TABLET | ORAL | Status: DC | PRN

## 2018-07-04 MED ORDER — LACTATED RINGERS IV SOLN: INTRAVENOUS | Status: DC

## 2018-07-04 MED ORDER — PRENATAL MULTIVITAMIN CH
1.0000 | ORAL_TABLET | Freq: Every day | ORAL | Status: DC
  Administered 2018-07-04 – 2018-07-05 (×2): 1 via ORAL
  Filled 2018-07-04 (×2): qty 1

## 2018-07-04 MED ORDER — METHYLERGONOVINE MALEATE 0.2 MG/ML IJ SOLN
0.2000 mg | Freq: Once | INTRAMUSCULAR | Status: AC
  Administered 2018-07-04: 0.2 mg via INTRAMUSCULAR

## 2018-07-04 MED ORDER — TETANUS-DIPHTH-ACELL PERTUSSIS 5-2.5-18.5 LF-MCG/0.5 IM SUSP: 0.5000 mL | Freq: Once | INTRAMUSCULAR | Status: DC

## 2018-07-04 MED ORDER — TRANEXAMIC ACID-NACL 1000-0.7 MG/100ML-% IV SOLN
1000.0000 mg | INTRAVENOUS | Status: AC
  Administered 2018-07-04: 1000 mg via INTRAVENOUS

## 2018-07-04 MED ORDER — BENZOCAINE-MENTHOL 20-0.5 % EX AERO
1.0000 "application " | INHALATION_SPRAY | CUTANEOUS | Status: DC | PRN
  Filled 2018-07-04: qty 56

## 2018-07-04 MED ORDER — MISOPROSTOL 200 MCG PO TABS
ORAL_TABLET | ORAL | Status: AC
  Administered 2018-07-04: 1000 ug via RECTAL
  Filled 2018-07-04: qty 5

## 2018-07-04 MED ORDER — TRANEXAMIC ACID-NACL 1000-0.7 MG/100ML-% IV SOLN: 1000.0000 mg | Freq: Once | INTRAVENOUS | Status: DC | PRN

## 2018-07-04 MED ORDER — WITCH HAZEL-GLYCERIN EX PADS: 1.0000 "application " | MEDICATED_PAD | CUTANEOUS | Status: DC | PRN

## 2018-07-04 MED ORDER — DIBUCAINE (PERIANAL) 1 % EX OINT: 1.0000 "application " | TOPICAL_OINTMENT | CUTANEOUS | Status: DC | PRN

## 2018-07-04 MED ORDER — TRANEXAMIC ACID-NACL 1000-0.7 MG/100ML-% IV SOLN
INTRAVENOUS | Status: AC
  Administered 2018-07-04: 1000 mg via INTRAVENOUS
  Filled 2018-07-04: qty 100

## 2018-07-04 MED ORDER — SODIUM CHLORIDE 0.9 % IV SOLN
INTRAVENOUS | Status: DC | PRN
  Administered 2018-07-04 (×2): 250 mL via INTRAVENOUS

## 2018-07-04 MED ORDER — COCONUT OIL OIL: 1.0000 "application " | TOPICAL_OIL | Status: DC | PRN

## 2018-07-04 MED ORDER — ACETAMINOPHEN 325 MG PO TABS: 650.0000 mg | ORAL_TABLET | ORAL | Status: DC | PRN

## 2018-07-04 MED ORDER — SIMETHICONE 80 MG PO CHEW: 80.0000 mg | CHEWABLE_TABLET | ORAL | Status: DC | PRN

## 2018-07-04 MED ORDER — MISOPROSTOL 200 MCG PO TABS
1000.0000 ug | ORAL_TABLET | Freq: Once | ORAL | Status: AC
  Administered 2018-07-04: 1000 ug via RECTAL

## 2018-07-04 MED ORDER — BUPROPION HCL 75 MG PO TABS
150.0000 mg | ORAL_TABLET | ORAL | Status: DC
  Administered 2018-07-04 – 2018-07-05 (×2): 150 mg via ORAL
  Filled 2018-07-04 (×4): qty 2

## 2018-07-04 MED ORDER — CLINDAMYCIN PHOSPHATE 900 MG/50ML IV SOLN
900.0000 mg | Freq: Three times a day (TID) | INTRAVENOUS | Status: DC
  Administered 2018-07-04: 900 mg via INTRAVENOUS
  Filled 2018-07-04 (×2): qty 50

## 2018-07-04 MED ORDER — ACETAMINOPHEN 500 MG PO TABS
1000.0000 mg | ORAL_TABLET | Freq: Once | ORAL | Status: AC
  Administered 2018-07-04: 1000 mg via ORAL
  Filled 2018-07-04: qty 2

## 2018-07-04 MED ORDER — IBUPROFEN 600 MG PO TABS
600.0000 mg | ORAL_TABLET | Freq: Four times a day (QID) | ORAL | Status: DC
  Administered 2018-07-04 – 2018-07-05 (×7): 600 mg via ORAL
  Filled 2018-07-04 (×7): qty 1

## 2018-07-04 MED ORDER — OXYCODONE HCL 5 MG PO TABS: 10.0000 mg | ORAL_TABLET | ORAL | Status: DC | PRN

## 2018-07-04 NOTE — Lactation Note (Signed)
This note was copied from a baby's chart. Lactation Consultation Note  Patient Name: Crystal Wilkerson [id number] Date: 07/04/2018 Reason for consult: Initial assessment;Term P2  Mom breastfed first baby for 18 months without difficulty.  Newborn is 8 hours old and has been to the breast 3 times.  Mom feels good about feedings.  Reviewed first 24 hour behavior.  Reminded to use good waking techniques during the feeding and stay skin to skin as much as possible.  Instructed to feed with cues and call for assist prn.  Breastfeeding consultation services and support information given and reviewed.  Maternal Data Does the patient have breastfeeding experience prior to this delivery?: Yes  Feeding    LATCH Score                   Interventions    Lactation Tools Discussed/Used     Consult Status Consult Status: Follow-up Date: 07/05/18 Follow-up type: In-patient    Huston Foley 07/04/2018, 1:34 PM

## 2018-07-04 NOTE — Anesthesia Postprocedure Evaluation (Signed)
Anesthesia Post Note  Patient: Crystal Wilkerson  Procedure(s) Performed: AN AD HOC LABOR EPIDURAL     Patient location during evaluation: Mother Baby Anesthesia Type: Epidural Level of consciousness: awake and alert and oriented Pain management: satisfactory to patient Vital Signs Assessment: post-procedure vital signs reviewed and stable Respiratory status: respiratory function stable Cardiovascular status: stable Postop Assessment: no headache, no backache, epidural receding, patient able to bend at knees, no signs of nausea or vomiting and adequate PO intake Anesthetic complications: no    Last Vitals:  Vitals:   07/04/18 0358 07/04/18 0838  BP: 119/68 102/67  Pulse: 98 84  Resp: 18 18  Temp: 37.4 C 36.8 C  SpO2:  97%    Last Pain:  Vitals:   07/04/18 0838  TempSrc: Oral  PainSc: 3    Pain Goal: Patients Stated Pain Goal: 0 (07/03/18 1954)              Epidural/Spinal Function Cutaneous sensation: Normal sensation (07/04/18 0838), Patient able to flex knees: Yes (07/04/18 3646), Patient able to lift hips off bed: Yes (07/04/18 0838), Back pain beyond tenderness at insertion site: No (07/04/18 0838), Progressively worsening motor and/or sensory loss: No (07/04/18 0838), Bowel and/or bladder incontinence post epidural: No (07/04/18 8032)  Karleen Dolphin

## 2018-07-04 NOTE — Progress Notes (Signed)
PPD 1 SVD  S:  Reports feeling ok - tired             Tolerating po/ No nausea or vomiting             Bleeding is small - moderate amount - nothing heavy now    O:   VS: BP 102/67 (BP Location: Right Arm)   Pulse 84   Temp 98.2 F (36.8 C) (Oral)   Resp 18   Ht 5\' 10"  (1.778 m)   Wt 93 kg   SpO2 97%   Breastfeeding Unknown   BMI 29.41 kg/m    Temps: 99.7 @ admit / 100.2 @ 0030 / tmax 102.1 @ 0130 / highest PP 100.2 @ 0300  LABS:             Recent Labs    07/03/18 2015 07/04/18 0539  WBC 18.2* 32.2*  HGB 12.3 11.1*  PLT 240 223               Blood type:A negative - newborn testing pending                I&O: Intake/Output      05/17 0701 - 05/18 0700 05/18 0701 - 05/19 0700   I.V. (mL/kg) 0 (0)    Other 0    IV Piggyback 0    Total Intake(mL/kg) 0 (0)    Urine (mL/kg/hr) 1000    Blood 1208    Total Output 2208    Net -2208                    Physical Exam:             Alert and oriented X3  Fundus: firm, non-tender, Ueven  Perineum: intact / ice pack in place             Lochia: small  A: PPD # O SVD              Immediate PP hemorrhage - third stage             Intrapartum fever - presumed chorioamnionitis on ABX              leukocytosis  P: Routine post partum orders             Continue ABX course until afebrile x 24 hour             CBC tomorrow   Marlinda Mike CNM, MSN, [facility] 07/04/2018, 10:40AM

## 2018-07-05 LAB — CBC
HCT: 29.9 % — ABNORMAL LOW (ref 36.0–46.0)
Hemoglobin: 9.8 g/dL — ABNORMAL LOW (ref 12.0–15.0)
MCH: 30.7 pg (ref 26.0–34.0)
MCHC: 32.8 g/dL (ref 30.0–36.0)
MCV: 93.7 fL (ref 80.0–100.0)
Platelets: 205 10*3/uL (ref 150–400)
RBC: 3.19 MIL/uL — ABNORMAL LOW (ref 3.87–5.11)
RDW: 14.4 % (ref 11.5–15.5)
WBC: 19.5 10*3/uL — ABNORMAL HIGH (ref 4.0–10.5)
nRBC: 0 % (ref 0.0–0.2)

## 2018-07-05 MED ORDER — MAGNESIUM OXIDE 400 (241.3 MG) MG PO TABS
400.0000 mg | ORAL_TABLET | Freq: Every day | ORAL | Status: DC
  Administered 2018-07-05: 400 mg via ORAL
  Filled 2018-07-05: qty 1

## 2018-07-05 MED ORDER — POLYSACCHARIDE IRON COMPLEX 150 MG PO CAPS: 150.0000 mg | ORAL_CAPSULE | Freq: Every day | ORAL | 3 refills | Status: DC

## 2018-07-05 MED ORDER — POLYSACCHARIDE IRON COMPLEX 150 MG PO CAPS
150.0000 mg | ORAL_CAPSULE | Freq: Every day | ORAL | Status: DC
  Administered 2018-07-05: 150 mg via ORAL
  Filled 2018-07-05: qty 1

## 2018-07-05 MED ORDER — IBUPROFEN 600 MG PO TABS: 600.0000 mg | ORAL_TABLET | Freq: Four times a day (QID) | ORAL | 0 refills | Status: DC

## 2018-07-05 NOTE — Progress Notes (Signed)
PPD #1, SVD, intact, baby boy "August"  S:  Reports feeling tired, foggy, and sore, but overall well; desires early d/c home; peds okay for baby to leave at 8pm if stable              Tolerating po/ No nausea or vomiting / Denies dizziness or SOB             Bleeding is light             Pain controlled with Motrin and Tylenol             Up ad lib / ambulatory / voiding QS  Newborn breast feeding - going well per mom / Circumcision - declined  O:               VS: BP 102/60 (BP Location: Right Arm)   Pulse 68   Temp 97.6 F (36.4 C) (Oral)   Resp 18   Ht 5\' 10"  (1.778 m)   Wt 93 kg   SpO2 97%   Breastfeeding Unknown   BMI 29.41 kg/m    LABS:              Recent Labs    07/04/18 0539 07/05/18 0547  WBC 32.2* 19.5*  HGB 11.1* 9.8*  PLT 223 205               Blood type: --/--/A NEG, A NEG Performed at [facility], [address] [place], [place] [address]  [phone] 2015)  Rubella:                       I&O: Intake/Output      05/18 0701 - 05/19 0700 05/19 0701 - 05/20 0700   I.V. (mL/kg)     Other     IV Piggyback     Total Intake(mL/kg)     Urine (mL/kg/hr)     Blood     Total Output     Net                        Physical Exam:             Alert and oriented X3  Lungs: Clear and unlabored  Heart: regular rate and rhythm / no murmurs  Abdomen: soft, non-tender, non-distended              Fundus: firm, non-tender, U-1  Perineum: intact, no edema, no erythema, no ecchymosis; ice pack in place  Lochia: small amount noted   Extremities: non-pitting LE edema, no calf pain or tenderness    A: PPD # 1, SVD  RH Negative - Baby A Negative - no Rhogam indicated  Immediate PPH third stage - stable  Intrapartum fever - presumed chorioamnionitis on ABX and has been afebrile for 24 hours             Leukocytosis - improving  Hx. Of Anxiety - stable on Wellbutrin 150mg  XL  Doing well - stable status  P: Routine post partum orders  Niferex 150mg  PO  daily  Magnesium oxide 400mg  PO daily  Discharge home this evening   WOB discharge book given, instructions and warning s/s reviewed  F/u for telehealth visit in 2 weeks for anxiety if needed, then 6 weeks for PP visit   Carlean Jews, MSN, CNM [facility]

## 2018-07-05 NOTE — Discharge Summary (Addendum)
Obstetric Discharge Summary   Patient Name: Crystal Wilkerson DOB: 03/16/1980 MRN: [id number]  Date of Admission: 07/03/2018 Date of Discharge: 07/05/2018 Date of Delivery: 07/04/2018 Gestational Age at Delivery: [redacted]w[redacted]d  Primary OB: [facility] - CNM management; Wiliam Ke, CNM   Antepartum complications:  - Dated by 1st trimester u/s, unsure LMP, rapid conception after IUD removed - MS, in remission - anxiety, stable on wellbutrin - GBS positive  Prenatal Labs:  ABO, Rh: A Neg (05/17 2015) A neg Antibody: Negative (05/17 2015) Rubella:  immune RPR:   NR HBsAg:   neg HIV:   neg  GBS:   pos 1 hr Glucola 105          Genetic screening nl Panorama Anatomy US normal  Admitting Diagnosis: Active labor at 40+5 weeks   Secondary Diagnoses: Patient Active Problem List   Diagnosis Date Noted  . Chorioamnionitis in third trimester 07/04/2018  . Postpartum care following vaginal delivery (5/18) 07/04/2018  . Indication for care in labor or delivery 12/26/2015    Augmentation: None Complications: Intrauterine Inflammation or infection (Chorioamnionitis) and Hemorrhage>1018mL  Date of Delivery: 07/04/2018 Delivered By: Dr. Ernestina Penna  Delivery Type: spontaneous vaginal delivery Anesthesia: epidural Placenta: sponatneous Laceration: none Episiotomy: none  Newborn Data: Live born female  Birth Weight: 9 lb 3.1 oz (4170 g) APGAR: 5, 8  Newborn Delivery   Birth date/time:  07/04/2018 00:17:00 Delivery type:  Vaginal, Spontaneous        Hospital/Postpartum Course  (Vaginal Delivery): Pt. Admitted in active labor at 40+5 weeks.  She developed a fever intrapartum and was treated for presumed chorioamnionitis with IV abx until she was 24 hours afebrile.  She also had an immediate postpartum hemorrhage third stage managed with Pitocin, Cytotec, TXA, and Methergine. Patient had an uncomplicated postpartum course.  By time of discharge on PPD#1, her pain was controlled on oral pain  medications; she had appropriate lochia and was ambulating, voiding without difficulty and tolerating regular diet.  She was deemed stable for discharge to home.    Labs: CBC Latest Ref Rng & Units 07/05/2018 07/04/2018 07/03/2018  WBC 4.0 - 10.5 K/uL 19.5(H) 32.2(H) 18.2(H)  Hemoglobin 12.0 - 15.0 g/dL 9.5(F) 11.1(L) 12.3  Hematocrit 36.0 - 46.0 % 29.9(L) 32.6(L) 37.1  Platelets 150 - 400 K/uL 205 223 240   Conflict (See Lab Report): A NEG/A NEG Performed at [facility], [address] [place], [place] [address]   Physical exam:  BP 102/60 (BP Location: Right Arm)   Pulse 68   Temp 97.6 F (36.4 C) (Oral)   Resp 18   Ht 5\' 10"  (1.778 m)   Wt 93 kg   SpO2 97%   Breastfeeding Unknown   BMI 29.41 kg/m  General: alert and no distress Pulm: normal respiratory effort Lochia: appropriate Abdomen: soft, NT Uterine Fundus: firm, below umbilicus Perineum: healing well, no significant erythema, no significant edema Extremities: No evidence of DVT seen on physical exam. No lower extremity edema.   Disposition: stable, discharge to home Baby Feeding: breast milk Baby Disposition: home with mom  Contraception: Vasectomy  Rh Immune globulin given: Not indicated, baby RH Negative Rubella vaccine given: N/A Tdap vaccine given in AP or PP setting: UTD Flu vaccine given in AP or PP setting: UTD   Plan:  Crystal Wilkerson was discharged to home in good condition. Follow-up appointment at [facility] in 6 weeks. 2 week telehealth visit if needed for anxiety.   Discharge Instructions: Per After Visit Summary.  Refer to After Visit Summary and [facility] discharge booklet  Activity: Advance as tolerated. Pelvic rest for 6 weeks.   Diet: Regular, Heart Healthy Discharge Medications: Allergies as of 07/05/2018   No Known Allergies     Medication List    TAKE these medications   buPROPion 100 MG tablet Commonly known as:  WELLBUTRIN Take 150 mg by mouth 1 day or 1  dose.   famotidine 20 MG tablet Commonly known as:  PEPCID Take 20 mg by mouth 2 (two) times daily.   ibuprofen 600 MG tablet Commonly known as:  ADVIL Take 1 tablet (600 mg total) by mouth every 6 (six) hours.   iron polysaccharides 150 MG capsule Commonly known as:  NIFEREX Take 1 capsule (150 mg total) by mouth daily. Start taking on:  Jul 06, 2018   LYSINE ACETATE PO Take 1 tablet by mouth daily.   prenatal multivitamin Tabs tablet Take 1 tablet by mouth daily at 12 noon.      Outpatient follow up:  Follow-up Information    Marlinda Mike, CNM. Schedule an appointment as soon as possible for a visit in 2 week(s).   Specialty:  Obstetrics and Gynecology Why:  Telehealth visit to evaluate anxiety if needed; then 6 week postpartum visit  Contact information: [address] [place] [address] [phone]           Signed:  Carlean Jews, MSN, CNM [facility]

## 2018-07-05 NOTE — Progress Notes (Signed)
CSW received consult for history of PPD and anxiety.  CSW met with MOB to offer support and complete assessment.    MOB sitting up in bed, breastfeeding infant, when CSW entered the room. CSW offered to come back at a later time but FOB and MOB welcoming of CSW coming in. CSW introduced self and received verbal permission to complete assessment with FOB present. CSW explained reason for consult and MOB expressed understanding. CSW inquired about MOB's mental health history and MOB acknowledged a history of postpartum anxiety with her 2-year-old. MOB described symptoms of not enjoying being a mother and experiencing some excessive worrying. MOB stated symptoms started almost immediately. Per MOB, she began counseling at [facility] and was started on Welbutrin which MOB noted to be helpful. MOB stated she is still taking Welbutrin but hopes to eventually discontinue medications. CSW provided education regarding the baby blues period vs. perinatal mood disorders, discussed treatment and gave resources for mental health follow up if concerns arise.  CSW recommends self-evaluation during the postpartum time period using the New Mom Checklist from Postpartum Progress and encouraged MOB to contact a medical professional if symptoms are noted at any time. MOB denied any current mental health symptoms and denied any SI or HI. MOB stated she has a good support system consisting of FOB, friends and her parents.   MOB confirmed having all essential items for infant once discharged. MOB stated infant would be sleeping in a basinet once home. CSW provided review of Sudden Infant Death Syndrome (SIDS) precautions and safe sleeping habits.    CSW identifies no further need for intervention and no barriers to discharge at this time.  Crystal Wilkerson, LCSWA  [facility] [phone]   

## 2018-07-05 NOTE — Lactation Note (Addendum)
This note was copied from a baby's chart. Lactation Consultation Note  Patient Name: Crystal Wilkerson [id number]'[id number] Date: 07/05/2018   Upon entry into room, Infant was noted to be at R breast--infant was sleepy (but swallowed with breast compressions) & alignment could be improved.  Mom had mild pinching on her R nipple when infant released latch with 1 tiny spot of abrasion & 1 tiny scab along the length of the compression stripe. Specifics of an asymmetric latch were shown via [employer].   We adjusted alignment with the next latch & made a point to get an asymmetric latch. Mom felt comfortable & Mom remarked that infant looked more comfortable, also. Swallows were noted to be more frequent. Mom is now able to identify the sound of swallows.   Mom has a history of an abundant milk supply. She inquired about onset of her milk coming to volume. I informed Mom that it would likely be like last time (quick onset), but did let Mom know that her Wellbutrin (L3) can sometimes delay onset. However, Mom's anatomy, veining, low-dose of Wellbutrin (150mg  qd) & baby's non-meconium stool suggest her Wellbutrin will not be an issue.    Mom noted to have a PPH (1200+ mL). Mom is aware of our virtual breastfeeding & "Mom Talk" support groups.  Lurline Hare [facility] 07/05/2018, 9:55 AM

## 2018-07-07 DIAGNOSIS — Z051 Observation and evaluation of newborn for suspected infectious condition ruled out: Secondary | ICD-10-CM | POA: Diagnosis not present

## 2018-07-30 DIAGNOSIS — G379 Demyelinating disease of central nervous system, unspecified: Secondary | ICD-10-CM | POA: Diagnosis not present

## 2018-08-09 DIAGNOSIS — G379 Demyelinating disease of central nervous system, unspecified: Secondary | ICD-10-CM | POA: Diagnosis not present

## 2018-08-15 DIAGNOSIS — O9081 Anemia of the puerperium: Secondary | ICD-10-CM | POA: Diagnosis not present

## 2018-08-23 ENCOUNTER — Other Ambulatory Visit: Payer: Self-pay | Admitting: Obstetrics and Gynecology

## 2018-08-23 DIAGNOSIS — N6320 Unspecified lump in the left breast, unspecified quadrant: Secondary | ICD-10-CM

## 2018-08-26 ENCOUNTER — Ambulatory Visit
Admission: RE | Admit: 2018-08-26 | Discharge: 2018-08-26 | Disposition: A | Discharge status: Home / Self Care | Payer: BC Managed Care – PPO | Source: Ambulatory Visit | Attending: Obstetrics and Gynecology | Admitting: Obstetrics and Gynecology

## 2018-08-26 ENCOUNTER — Other Ambulatory Visit: Payer: Self-pay | Admitting: Obstetrics and Gynecology

## 2018-08-26 DIAGNOSIS — N6320 Unspecified lump in the left breast, unspecified quadrant: Secondary | ICD-10-CM

## 2018-08-26 DIAGNOSIS — R922 Inconclusive mammogram: Secondary | ICD-10-CM | POA: Diagnosis not present

## 2018-08-26 DIAGNOSIS — N6321 Unspecified lump in the left breast, upper outer quadrant: Secondary | ICD-10-CM | POA: Diagnosis not present

## 2018-10-04 DIAGNOSIS — R350 Frequency of micturition: Secondary | ICD-10-CM | POA: Diagnosis not present

## 2018-10-04 DIAGNOSIS — N898 Other specified noninflammatory disorders of vagina: Secondary | ICD-10-CM | POA: Diagnosis not present

## 2018-11-14 DIAGNOSIS — N898 Other specified noninflammatory disorders of vagina: Secondary | ICD-10-CM | POA: Diagnosis not present

## 2018-11-14 DIAGNOSIS — N3000 Acute cystitis without hematuria: Secondary | ICD-10-CM | POA: Diagnosis not present

## 2018-11-25 ENCOUNTER — Other Ambulatory Visit: Payer: Self-pay | Admitting: Emergency Medicine

## 2018-11-25 DIAGNOSIS — Z20822 Contact with and (suspected) exposure to covid-19: Secondary | ICD-10-CM

## 2018-11-25 DIAGNOSIS — Z20828 Contact with and (suspected) exposure to other viral communicable diseases: Secondary | ICD-10-CM | POA: Diagnosis not present

## 2018-11-26 LAB — NOVEL CORONAVIRUS, NAA: SARS-CoV-2, NAA: NOT DETECTED

## 2018-11-28 DIAGNOSIS — Z01419 Encounter for gynecological examination (general) (routine) without abnormal findings: Secondary | ICD-10-CM | POA: Diagnosis not present

## 2018-11-28 DIAGNOSIS — N898 Other specified noninflammatory disorders of vagina: Secondary | ICD-10-CM | POA: Diagnosis not present

## 2018-11-28 DIAGNOSIS — Z6826 Body mass index (BMI) 26.0-26.9, adult: Secondary | ICD-10-CM | POA: Diagnosis not present

## 2018-12-02 DIAGNOSIS — R21 Rash and other nonspecific skin eruption: Secondary | ICD-10-CM | POA: Diagnosis not present

## 2018-12-02 LAB — RESULTS CONSOLE HPV: CHL HPV: NEGATIVE

## 2018-12-02 LAB — HM PAP SMEAR: HM Pap smear: NEGATIVE

## 2018-12-19 DIAGNOSIS — Z1329 Encounter for screening for other suspected endocrine disorder: Secondary | ICD-10-CM | POA: Diagnosis not present

## 2018-12-19 DIAGNOSIS — N390 Urinary tract infection, site not specified: Secondary | ICD-10-CM | POA: Diagnosis not present

## 2018-12-19 DIAGNOSIS — Z131 Encounter for screening for diabetes mellitus: Secondary | ICD-10-CM | POA: Diagnosis not present

## 2018-12-19 DIAGNOSIS — Z0000 Encounter for general adult medical examination without abnormal findings: Secondary | ICD-10-CM | POA: Diagnosis not present

## 2018-12-19 DIAGNOSIS — Z130 Encounter for screening for diseases of the blood and blood-forming organs and certain disorders involving the immune mechanism: Secondary | ICD-10-CM | POA: Diagnosis not present

## 2018-12-19 DIAGNOSIS — Z13220 Encounter for screening for lipoid disorders: Secondary | ICD-10-CM | POA: Diagnosis not present

## 2018-12-29 DIAGNOSIS — G379 Demyelinating disease of central nervous system, unspecified: Secondary | ICD-10-CM | POA: Diagnosis not present

## 2019-02-06 ENCOUNTER — Other Ambulatory Visit (HOSPITAL_BASED_OUTPATIENT_CLINIC_OR_DEPARTMENT_OTHER): Payer: Self-pay | Admitting: Family Medicine

## 2019-02-06 ENCOUNTER — Ambulatory Visit (HOSPITAL_BASED_OUTPATIENT_CLINIC_OR_DEPARTMENT_OTHER)
Admission: RE | Admit: 2019-02-06 | Discharge: 2019-02-06 | Disposition: A | Discharge status: Home / Self Care | Payer: BC Managed Care – PPO | Source: Ambulatory Visit | Attending: Family Medicine | Admitting: Family Medicine

## 2019-02-06 ENCOUNTER — Other Ambulatory Visit: Payer: Self-pay

## 2019-02-06 DIAGNOSIS — L989 Disorder of the skin and subcutaneous tissue, unspecified: Secondary | ICD-10-CM | POA: Insufficient documentation

## 2019-02-06 DIAGNOSIS — M7989 Other specified soft tissue disorders: Secondary | ICD-10-CM | POA: Diagnosis not present

## 2019-02-27 ENCOUNTER — Ambulatory Visit
Admission: RE | Admit: 2019-02-27 | Discharge: 2019-02-27 | Disposition: A | Discharge status: Home / Self Care | Payer: Managed Care, Other (non HMO) | Source: Ambulatory Visit | Attending: Obstetrics and Gynecology | Admitting: Obstetrics and Gynecology

## 2019-02-27 ENCOUNTER — Other Ambulatory Visit: Payer: Self-pay

## 2019-02-27 DIAGNOSIS — N6320 Unspecified lump in the left breast, unspecified quadrant: Secondary | ICD-10-CM

## 2019-03-15 ENCOUNTER — Ambulatory Visit: Payer: Managed Care, Other (non HMO) | Attending: Internal Medicine

## 2019-03-15 DIAGNOSIS — Z20822 Contact with and (suspected) exposure to covid-19: Secondary | ICD-10-CM

## 2019-03-16 LAB — NOVEL CORONAVIRUS, NAA: SARS-CoV-2, NAA: NOT DETECTED

## 2019-04-22 ENCOUNTER — Ambulatory Visit: Payer: Managed Care, Other (non HMO) | Attending: Internal Medicine

## 2019-04-22 DIAGNOSIS — Z23 Encounter for immunization: Secondary | ICD-10-CM | POA: Insufficient documentation

## 2019-04-22 NOTE — Progress Notes (Signed)
   Covid-19 Vaccination Clinic  Name:  Crystal Wilkerson    MRN: [id number] DOB: Sep 23, 1980  04/22/2019  [name] was observed post Covid-19 immunization for 15 minutes without incident. She was provided with Vaccine Information Sheet and instruction to access the V-Safe system.   [name] was instructed to call 911 with any severe reactions post vaccine: Marland Kitchen Difficulty breathing  . Swelling of face and throat  . A fast heartbeat  . A bad rash all over body  . Dizziness and weakness   Immunizations Administered    Name Date Dose VIS Date Route   Pfizer COVID-19 Vaccine 04/22/2019  5:23 PM 0.3 mL 01/27/2019 Intramuscular   Manufacturer: [employer], [employer]   Lot: [id number]   NDC: [address]-1

## 2019-05-13 ENCOUNTER — Ambulatory Visit: Payer: Managed Care, Other (non HMO) | Attending: Internal Medicine

## 2019-05-13 DIAGNOSIS — Z23 Encounter for immunization: Secondary | ICD-10-CM

## 2019-05-13 NOTE — Progress Notes (Signed)
   Covid-19 Vaccination Clinic  Name:  Aviyana Sonntag    MRN: [id number] DOB: November 29, 1980  05/13/2019  Ms. Masse was observed post Covid-19 immunization for 15 minutes without incident. She was provided with Vaccine Information Sheet and instruction to access the V-Safe system.   Ms. Becvar was instructed to call 911 with any severe reactions post vaccine: Marland Kitchen Difficulty breathing  . Swelling of face and throat  . A fast heartbeat  . A bad rash all over body  . Dizziness and weakness   Immunizations Administered    Name Date Dose VIS Date Route   Pfizer COVID-19 Vaccine 05/13/2019  1:41 PM 0.3 mL 01/27/2019 Intramuscular   Manufacturer: [employer], [employer]   Lot: [id number]   NDC: [address]-2

## 2019-05-23 ENCOUNTER — Ambulatory Visit: Payer: Managed Care, Other (non HMO)

## 2020-03-07 IMAGING — MG DIGITAL DIAGNOSTIC BILATERAL MAMMOGRAM WITH TOMO AND CAD
6 of 10 series · 6 of 30 positions shown · non-contrast
Comparison: None.

CLINICAL DATA: 37-year-old female presenting for evaluation of a
palpable lump in the upper-outer left breast. She had mastitis
affecting this area last month (the patient is breast feeding), and
she noticed palpable lump after the mastitis resolved. She has
family history of breast cancer in a paternal grandmother.

EXAM:
DIGITAL DIAGNOSTIC BILATERAL MAMMOGRAM WITH CAD AND TOMO
ULTRASOUND LEFT BREAST

[L TAN synth-2D]
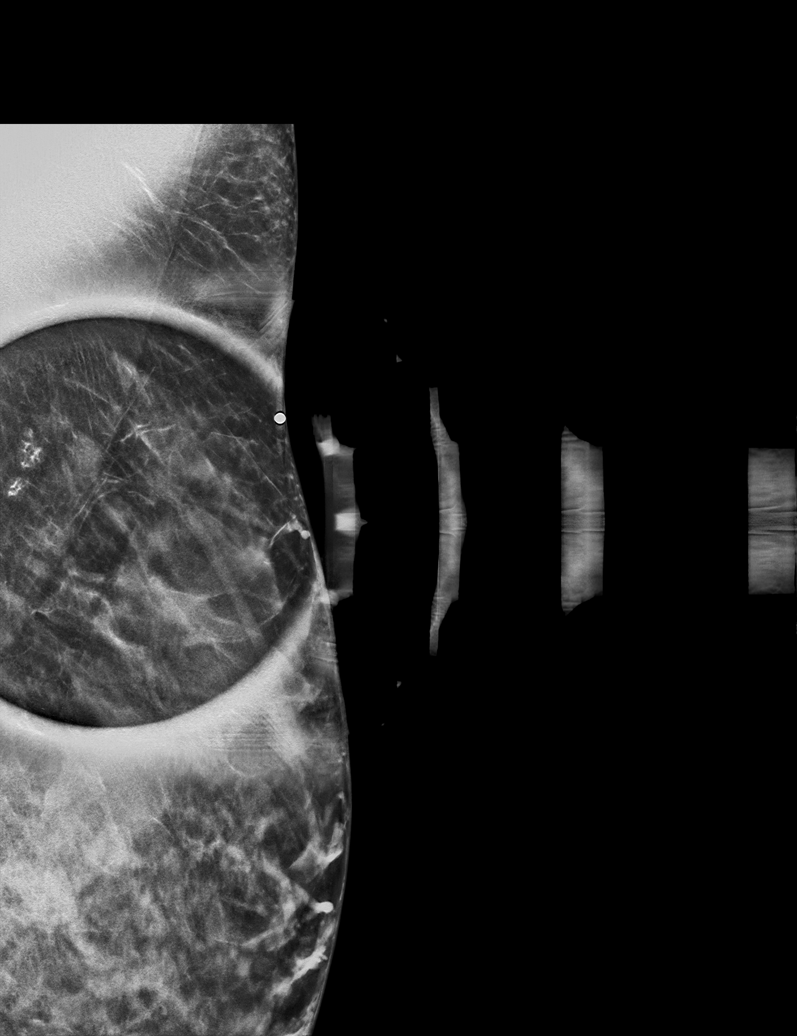

[L MLO synth-2D]
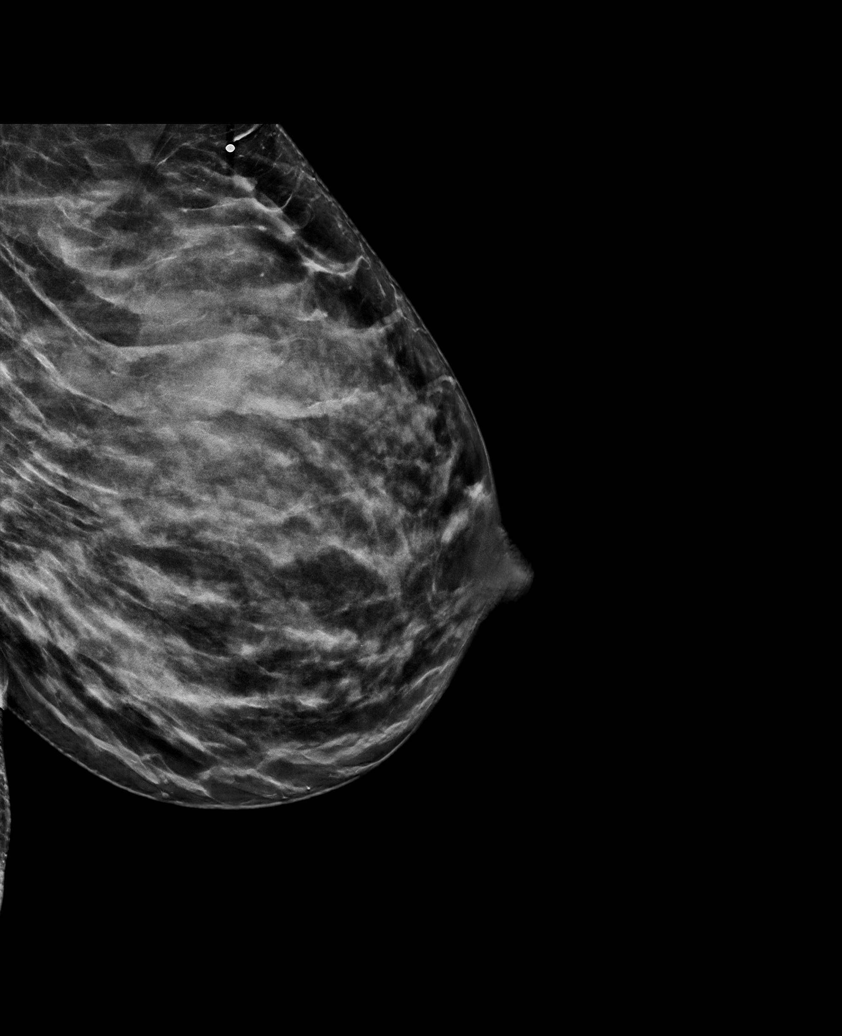

[R CC synth-2D]
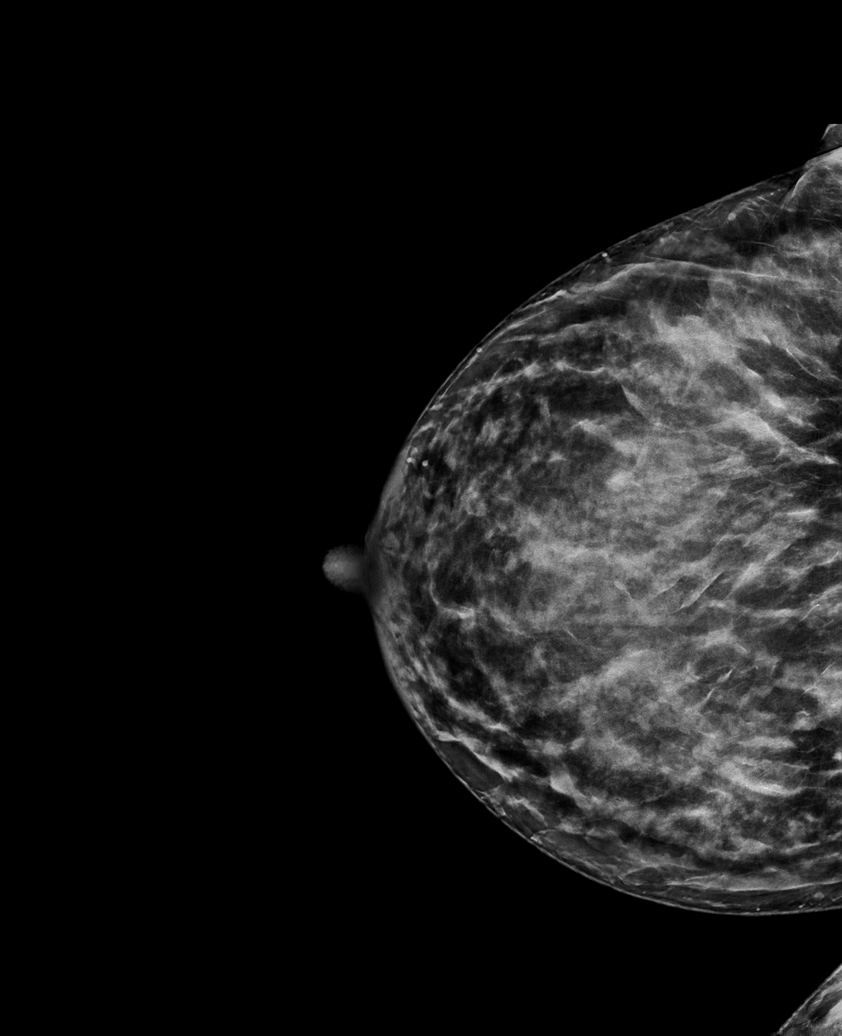

[L CC synth-2D]
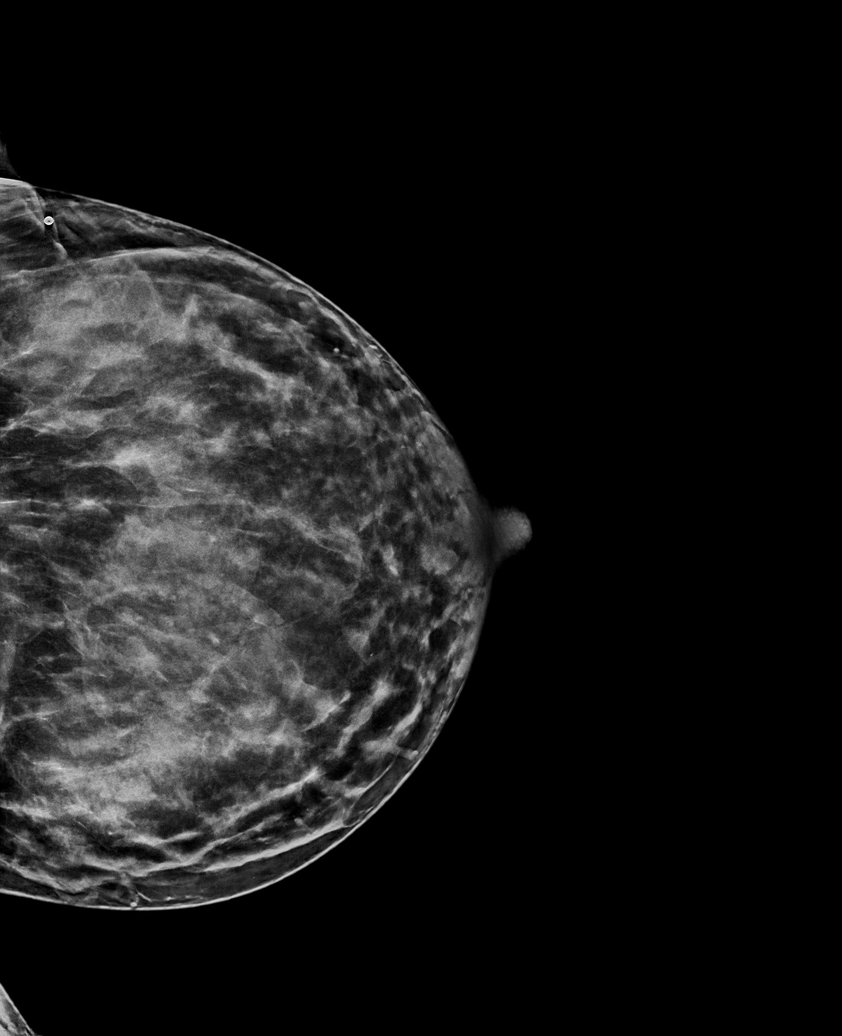

[R MLO synth-2D]
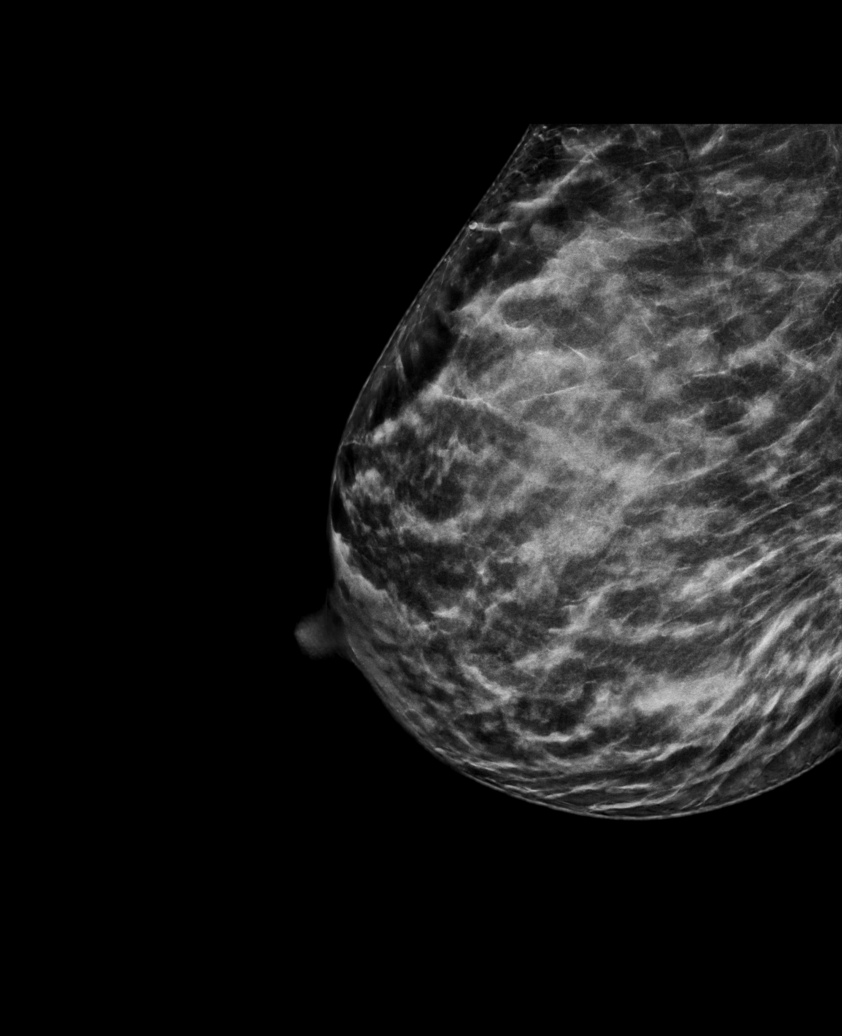

[R CC tomo · tomo slice 38/75.0]
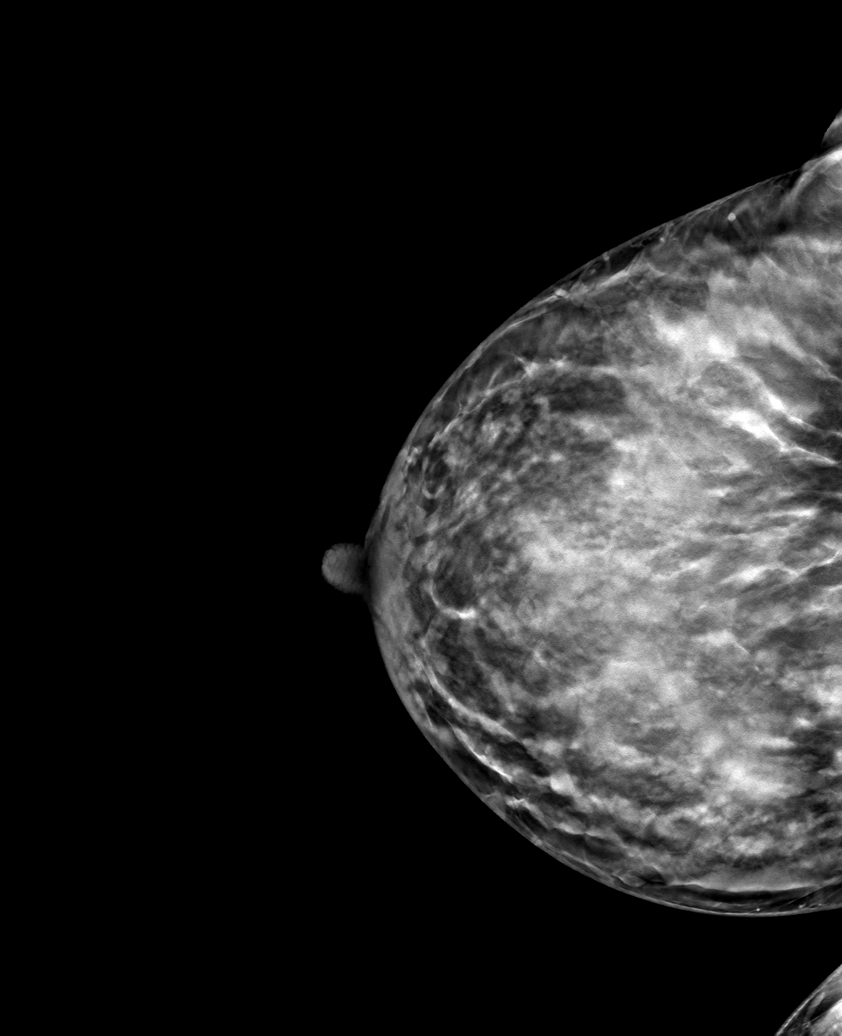

[6 of 30 positions shown; findings below may reference images not displayed]

ACR Breast Density Category d: The breast tissue is extremely dense,
which lowers the sensitivity of mammography.
FINDINGS: A BB indicating the palpable site of concern has been placed on the
upper-outer quadrant of the left breast. No suspicious masses are
seen immediately deep to the palpable marker, however on the spot
compression tomosynthesis images 2 small possibly partially
calcified lymph nodes can be seen.

Mammographic images were processed with CAD.

On physical exam, there is a firm smooth superficial palpable mass
at the site of concern in the upper-outer left breast. The patient
has a large tattoo on the lateral aspect of her chest and on her
back.

Targeted ultrasound is performed, showing a circumscribed oval
hypoechoic mass measuring 0.9 x 0.3 x 0.7 cm at [DATE], 11 cm from the
nipple. The longest dimension is in the oblique orientation
measuring 1.1 cm. Just distal to this is a prominent lymph node
measuring 3.2 cm in long axis, with a cortical thickness of 3 mm.
High density foci are seen within the mass corresponding with the
apparent calcifications/pigmentation seen on the mammogram.
IMPRESSION: 1. The palpable mass in the upper-outer quadrant of the left breast
has benign features and may represent a lymph node or a
fibroadenoma.

2. The high density within the left axillary lymph node is likely
related to pigmentation from the patient's tattoos.

3.  No mammographic evidence of malignancy in the bilateral breasts.

RECOMMENDATION:
1. Six-month follow-up left breast ultrasound is recommended to
monitor the probably benign mass at [DATE]. The patient was instructed
to examine the palpable area monthly, and that if she feels the mass
has increased in size she should return sooner than the six-month
follow-up.

I have discussed the findings and recommendations with the patient.
Results were also provided in writing at the conclusion of the
visit. If applicable, a reminder letter will be sent to the patient
regarding the next appointment.

BI-RADS CATEGORY  3: Probably benign.

## 2020-07-18 ENCOUNTER — Telehealth: Payer: Self-pay

## 2020-07-18 NOTE — Telephone Encounter (Signed)
Pt. Called back scheduled for a new pt. CPE at 9:45 Monday morning. She was told to come in 30 minutes early at 9:15 and bring ID and ins. Card.

## 2020-07-18 NOTE — Telephone Encounter (Signed)
Pt. Called stating you see her mom Crystal Wilkerson and wanted to know if you could see her as a new pt. She stated that it had been discussed with her mom and was ok. Just wanted to run it by you first before scheduling

## 2020-07-18 NOTE — Telephone Encounter (Signed)
Called pt. Had to LM.  

## 2020-07-18 NOTE — Telephone Encounter (Signed)
Yes, I will see her.  See if she wants to be seen on Monday--I have plenty of openings, even for a physical (if she is due for one)!

## 2020-07-21 NOTE — Progress Notes (Signed)
Chief Complaint  Patient presents with  . Annual Exam    New patient annual exam, no pap- sees [facility] [employer] [facility] and is UTD. Is having right breast pain x 6-7 weeks. No other concerns.     Patient presents to establish care, and for complete physical. She has the following concerns:  She is having R sided breast pain x 6-7 weeks.  Pain is lateral, doesn't feel a lump.  Her cycle was a week late, got it today. Pain is dull, doesn't notice it all the time.  No pain with arm movements. Got a rowing machine for Mother's Day, but pain started a few weeks prior to that.  She has suspected MS (clinically isolated syndrome, no progression, based on initial MRI showing white matter disease, but no further changes); has been under the care of neurologist, last seen via virtual visit last month.  MRI is planned. Last MRI was 07/2018 and was stable, despite any immunotherapy. They are on "watchful waiting". Diagnosis came related to testing for urinary issues. She continues to have urinary frequency at night. She was prescribed ditropan 5mg  to use at night prn. She started this last week, and nocturia is much improved (she is up 1-2x/night, down from 5-6x).  Energy has improved since sleeping better.  She has some leakage of urine with laughing, coughing. She wears special panties due to her issues with leakage.  H/o Postpartum depression.  She stayed on Wellbutrin after first child, lowered the dose, increased it back to 300mg  during the pandemic.  Currently doing well.  She continues to see her therapist monthly for "tune-ups".  Immunization History  Administered Date(s) Administered  . Influenza,inj,Quad PF,6+ Mos 10/04/2018  . Influenza,inj,quad, With Preservative 10/18/2014  . Influenza-Unspecified 11/04/2018, 11/17/2019  . PFIZER(Purple Top)SARS-COV-2 Vaccination 04/22/2019, 05/13/2019, 01/01/2020  . Tdap 01/01/2014   Last pap: due in November, UTD through [place]: 08/2018 (with f/u US on L in  02/2019 Last DEXA: never Last colonoscopy: 2019 through [facility] (normal; IBS) Dentist: twice a year Ophtho: every 1-2 years, contacts/glasses Exercise:  3x/week rowing machine, 20 minutes.  Walks daily ("leisurely stroll") with dogs, kids.   Lipid screen: recalls it was high after having her son 2 years ago.  Lipids improved after weight loss with [employer].  She had been eating fast food; now infrequent.   PMH, PSH, SH and FH were reviewed and chart updated  Outpatient Encounter Medications as of 07/22/2020  Medication Sig Note  . buPROPion (WELLBUTRIN XL) 300 MG 24 hr tablet bupropion HCl XL 300 mg 24 hr tablet, extended release   . cholecalciferol (VITAMIN D3) 25 MCG (1000 UNIT) tablet Take 1,000 Units by mouth daily.   Marland Kitchen doxycycline (VIBRAMYCIN) 100 MG capsule Take 100 mg by mouth 2 (two) times daily. 07/22/2020: For peri-oral dermatitis from derm (almost finished with course)  . LYSINE ACETATE PO Take 1 tablet by mouth daily.   Marland Kitchen oxybutynin (DITROPAN) 5 MG tablet Take 5 mg by mouth at bedtime. 07/22/2020: Takes qHS  . Peppermint Oil (IBGARD PO) Take 1 tablet by mouth daily.   . [DISCONTINUED] buPROPion (WELLBUTRIN) 100 MG tablet Take 150 mg by mouth daily.    . [DISCONTINUED] famotidine (PEPCID) 20 MG tablet Take 20 mg by mouth 2 (two) times daily.   . [DISCONTINUED] ibuprofen (ADVIL) 600 MG tablet Take 1 tablet (600 mg total) by mouth every 6 (six) hours.   . [DISCONTINUED] iron polysaccharides (NIFEREX) 150 MG capsule Take 1 capsule (150 mg total) by  mouth daily.   . [DISCONTINUED] Prenatal Vit-Fe Fumarate-FA (PRENATAL MULTIVITAMIN) TABS tablet Take 1 tablet by mouth daily at 12 noon.    No facility-administered encounter medications on file as of 07/22/2020.    ROS:  The patient denies anorexia, fever, recent weight changes, headaches,  vision changes, decreased hearing, ear pain, sore throat, breast concerns, chest pain, palpitations, dizziness, syncope, dyspnea on exertion, cough,  swelling, nausea, vomiting, constipation, abdominal pain, melena, hematochezia, indigestion/heartburn, hematuria,  dysuria, irregular menstrual cycles (other than this cycle being a week late), vaginal discharge, odor or itch, genital lesions, joint pains, numbness, tingling, weakness, tremor, suspicious skin lesions, depression, anxiety, abnormal bleeding/bruising, or enlarged lymph nodes. Currently has mild cramps related to her cycle. IBS, not currently flaring (cutting down on coffee and IB Guard has helped).  Typical symptoms are diarrhea and cramping. Nocturia, improved with ditropan.  Some leakage during the day, see HPI. Saw dermatologist recently. R lateral breast pain per HPI.   PHYSICAL EXAM:  BP 110/70   Pulse 68   Ht 5' 9.75" (1.772 m)   Wt 168 lb (76.2 kg)   BMI 24.28 kg/m   General Appearance:    Alert, cooperative, no distress, appears stated age  Head:    Normocephalic, without obvious abnormality, atraumatic  Eyes:    PERRL, conjunctiva/corneas clear, EOM's intact, fundi    benign  Ears:    Normal TM's and external ear canals  Nose:   Not examined, wearing mask due to COVID-19 pandemic  Throat:   Not examined, wearing mask due to COVID-19 pandemic  Neck:   Supple, no lymphadenopathy;  thyroid:  no enlargement/ tenderness/nodules; no carotid bruit or JVD  Back:    Spine nontender, no curvature, ROM normal, no CVA     tenderness  Lungs:     Clear to auscultation bilaterally without wheezes, rales or     ronchi; respirations unlabored  Chest Wall:    No tenderness or deformity   Heart:    Regular rate and rhythm, S1 and S2 normal, no murmur, rub   or gallop  Breast Exam:    Right breast was examined due to her pain complaints.  No nipple discharge or inversion.  There are some mild fibrocystic changes throughout, with focal tenderness along the inferolateral portion of the right breast--small cyst vs fibrocystic changes.  No axillary adenopathy noted.   Abdomen:      Soft, non-tender, nondistended, normoactive bowel sounds,    no masses, no hepatosplenomegaly  Genitalia:    Deferred to GYN     Extremities:   No clubbing, cyanosis or edema  Pulses:   2+ and symmetric all extremities  Skin:   Skin color, texture, turgor normal, no rashes or lesions  Lymph nodes:   Cervical, supraclavicular, and axillary nodes normal  Neurologic:   Normal strength, sensation and gait; reflexes 2+ and symmetric throughout          Psych:   Normal mood, affect, hygiene and grooming.     ASSESSMENT/PLAN:  Annual physical exam - Plan: POCT Urinalysis DIP (Proadvantage Device), TSH, VITAMIN D 25 Hydroxy (Vit-D Deficiency, Fractures), CBC with Differential/Platelet, Comprehensive metabolic panel, Lipid panel, Hepatitis C antibody  Pure hypercholesterolemia - due for recheck; diet remains improved compared to when it was elevated in past - Plan: Lipid panel  Fatigue, unspecified type - improved since nocturia improved with ditropan - Plan: TSH, VITAMIN D 25 Hydroxy (Vit-D Deficiency, Fractures), CBC with Differential/Platelet, Comprehensive metabolic panel  Need for hepatitis  C screening test - Plan: Hepatitis C antibody  Breast pain, right - eval with mammo/ultrasound. Suspect small cyst vs FC changes. Okay to wait 1-2 wks on imaging (on cycle now, can cancel if completely resolves)  Multiple sclerosis ([place]) - due for MRI; hasn't had any progession, not on treatment.  Under care of neuro   Focal tenderness at inferolateral R breast.  Palpable tenderness--suspect small cyst vs Fibroglandular/cystic changes. eval with diagnostic mammo and [place]   Cbc, lipid, c-met, D, TSH Hep C  Patient to sign release of records (prior PCP at [facility], colonoscopy, etc)  Discussed monthly self breast exams and yearly mammograms after the age of 54; at least 30 minutes of aerobic activity at least 5 days/week, weight-bearing exercise at least 2x/week, core strengthening exercises recommended;  proper sunscreen use reviewed; healthy diet, including goals of calcium and vitamin D intake and alcohol recommendations (less than or equal to 1 drink/day) reviewed; regular seatbelt use; changing batteries in smoke detectors.  Immunization recommendations discussed--continue yearly flu shots.  Colonoscopy recommendations reviewed, age 7.

## 2020-07-22 ENCOUNTER — Other Ambulatory Visit: Payer: Self-pay

## 2020-07-22 ENCOUNTER — Ambulatory Visit: Payer: 59 | Admitting: Family Medicine

## 2020-07-22 ENCOUNTER — Other Ambulatory Visit: Payer: Self-pay | Admitting: Family Medicine

## 2020-07-22 ENCOUNTER — Encounter: Payer: Self-pay | Admitting: Family Medicine

## 2020-07-22 ENCOUNTER — Other Ambulatory Visit: Payer: Self-pay | Admitting: *Deleted

## 2020-07-22 VITALS — BP 110/70 | HR 68 | Ht 69.75 in | Wt 168.0 lb

## 2020-07-22 DIAGNOSIS — Z0000 Encounter for general adult medical examination without abnormal findings: Secondary | ICD-10-CM | POA: Diagnosis not present

## 2020-07-22 DIAGNOSIS — Z1159 Encounter for screening for other viral diseases: Secondary | ICD-10-CM

## 2020-07-22 DIAGNOSIS — N644 Mastodynia: Secondary | ICD-10-CM

## 2020-07-22 DIAGNOSIS — E7800 Pure hypercholesterolemia, unspecified: Secondary | ICD-10-CM

## 2020-07-22 DIAGNOSIS — R5383 Other fatigue: Secondary | ICD-10-CM | POA: Diagnosis not present

## 2020-07-22 DIAGNOSIS — G35 Multiple sclerosis: Secondary | ICD-10-CM

## 2020-07-22 LAB — POCT URINALYSIS DIP (PROADVANTAGE DEVICE)
Bilirubin, UA: NEGATIVE
Glucose, UA: NEGATIVE mg/dL
Ketones, POC UA: NEGATIVE mg/dL
Leukocytes, UA: NEGATIVE
Nitrite, UA: NEGATIVE
Protein Ur, POC: NEGATIVE mg/dL
Specific Gravity, Urine: 1.01
Urobilinogen, Ur: NEGATIVE
pH, UA: 6 (ref 5.0–8.0)

## 2020-07-22 NOTE — Patient Instructions (Signed)

## 2020-07-23 LAB — COMPREHENSIVE METABOLIC PANEL
ALT: 16 IU/L (ref 0–32)
AST: 16 IU/L (ref 0–40)
Albumin/Globulin Ratio: 2 (ref 1.2–2.2)
Albumin: 4.8 g/dL (ref 3.8–4.8)
Alkaline Phosphatase: 48 IU/L (ref 44–121)
BUN/Creatinine Ratio: 11 (ref 9–23)
BUN: 11 mg/dL (ref 6–20)
Bilirubin Total: 1.1 mg/dL (ref 0.0–1.2)
CO2: 20 mmol/L (ref 20–29)
Calcium: 9.7 mg/dL (ref 8.7–10.2)
Chloride: 104 mmol/L (ref 96–106)
Creatinine, Ser: 0.96 mg/dL (ref 0.57–1.00)
Globulin, Total: 2.4 g/dL (ref 1.5–4.5)
Glucose: 84 mg/dL (ref 65–99)
Potassium: 4.2 mmol/L (ref 3.5–5.2)
Sodium: 140 mmol/L (ref 134–144)
Total Protein: 7.2 g/dL (ref 6.0–8.5)
eGFR: 78 mL/min/{1.73_m2} (ref >59)

## 2020-07-23 LAB — CBC WITH DIFFERENTIAL/PLATELET
Basophils Absolute: 0 10*3/uL (ref 0.0–0.2)
Basos: 1 %
EOS (ABSOLUTE): 0.2 10*3/uL (ref 0.0–0.4)
Eos: 3 %
Hematocrit: 36.6 % (ref 34.0–46.6)
Hemoglobin: 12.4 g/dL (ref 11.1–15.9)
Immature Grans (Abs): 0 10*3/uL (ref 0.0–0.1)
Immature Granulocytes: 0 %
Lymphocytes Absolute: 2.6 10*3/uL (ref 0.7–3.1)
Lymphs: 47 %
MCH: 29.7 pg (ref 26.6–33.0)
MCHC: 33.9 g/dL (ref 31.5–35.7)
MCV: 88 fL (ref 79–97)
Monocytes Absolute: 0.6 10*3/uL (ref 0.1–0.9)
Monocytes: 11 %
Neutrophils Absolute: 2.1 10*3/uL (ref 1.4–7.0)
Neutrophils: 38 %
Platelets: 299 10*3/uL (ref 150–450)
RBC: 4.17 x10E6/uL (ref 3.77–5.28)
RDW: 12.5 % (ref 11.7–15.4)
WBC: 5.5 10*3/uL (ref 3.4–10.8)

## 2020-07-23 LAB — TSH: TSH: 1.57 u[IU]/mL (ref 0.450–4.500)

## 2020-07-23 LAB — LIPID PANEL
Chol/HDL Ratio: 2.5 ratio (ref 0.0–4.4)
Cholesterol, Total: 209 mg/dL — ABNORMAL HIGH (ref 100–199)
HDL: 83 mg/dL (ref >39)
LDL Chol Calc (NIH): 115 mg/dL — ABNORMAL HIGH (ref 0–99)
Triglycerides: 62 mg/dL (ref 0–149)
VLDL Cholesterol Cal: 11 mg/dL (ref 5–40)

## 2020-07-23 LAB — VITAMIN D 25 HYDROXY (VIT D DEFICIENCY, FRACTURES): Vit D, 25-Hydroxy: 35.5 ng/mL (ref 30.0–100.0)

## 2020-07-23 LAB — HEPATITIS C ANTIBODY: Hep C Virus Ab: <0.1 s/co ratio (ref 0.0–0.9)

## 2020-08-18 IMAGING — US US EXTREM LOW*R* LIMITED
1 series · 14 of 16 positions shown · non-contrast
Comparison: No recent.

CLINICAL DATA: Skin lesion on right leg.

EXAM:
ULTRASOUND RIGHT LOWER EXTREMITY LIMITED
TECHNIQUE: Ultrasound examination of the lower extremity soft tissues was
performed in the area of clinical concern.

[Series 1: us extrem low*right* limited · 16 acquisitions, 14 frames shown]
[im 1/16]
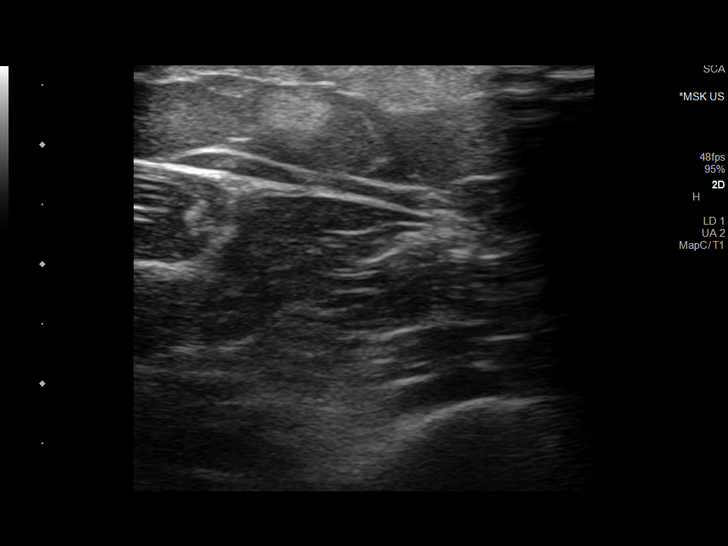
[im 2/16]
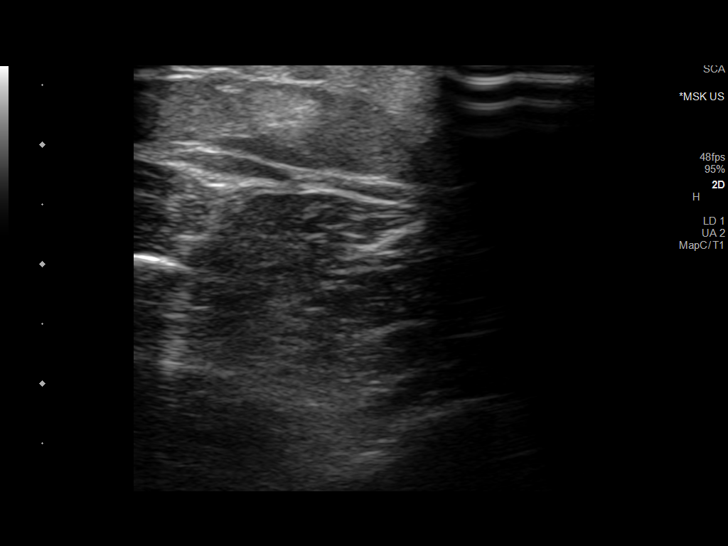
[im 3/16]
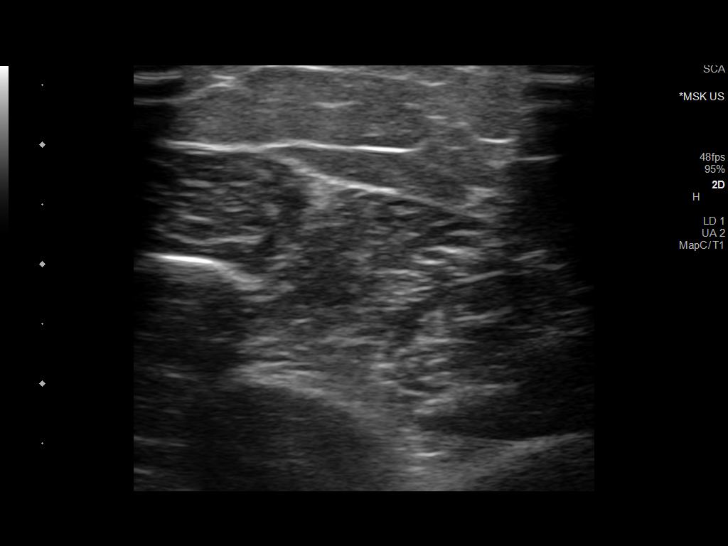
[im 5/16]
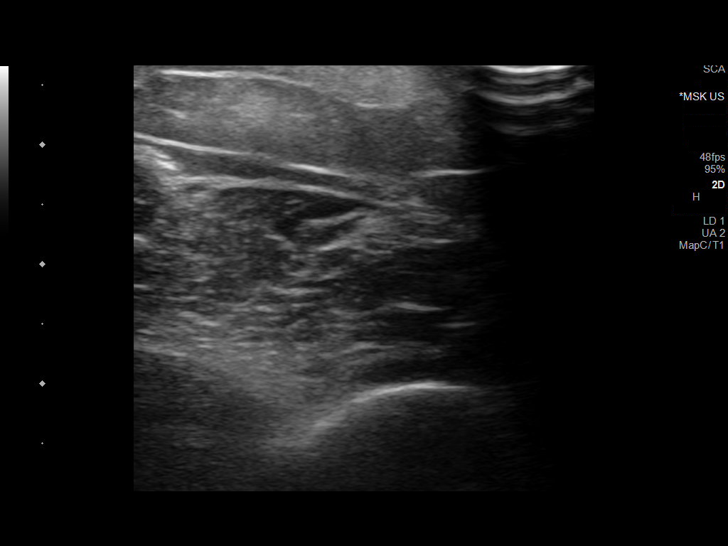
[im 6/16]
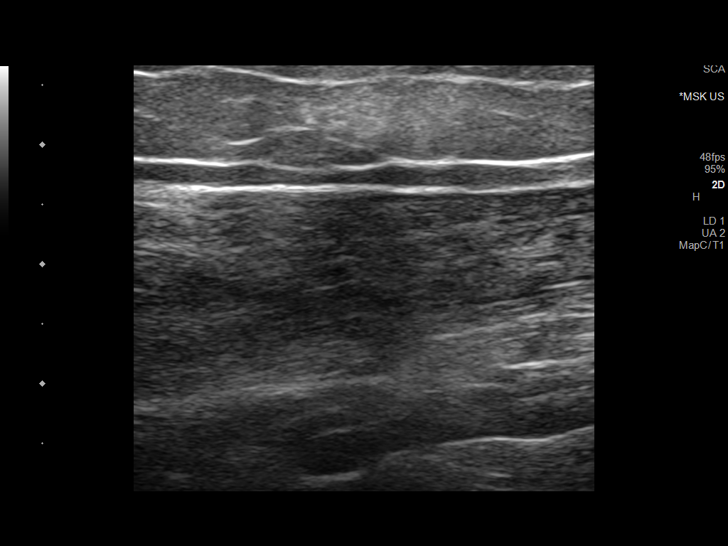
[im 7/16]
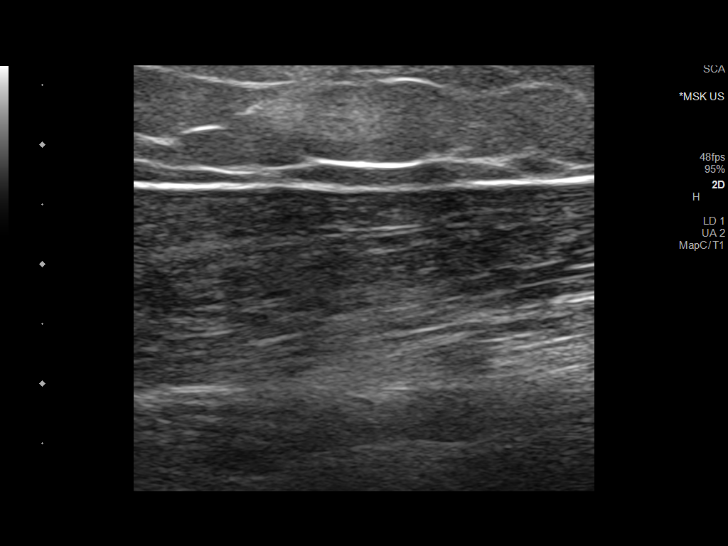
[im 8/16]
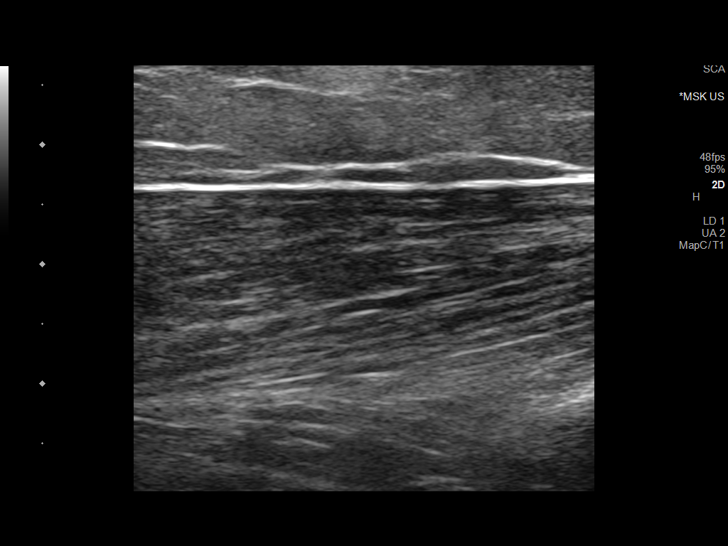
[im 9/16]
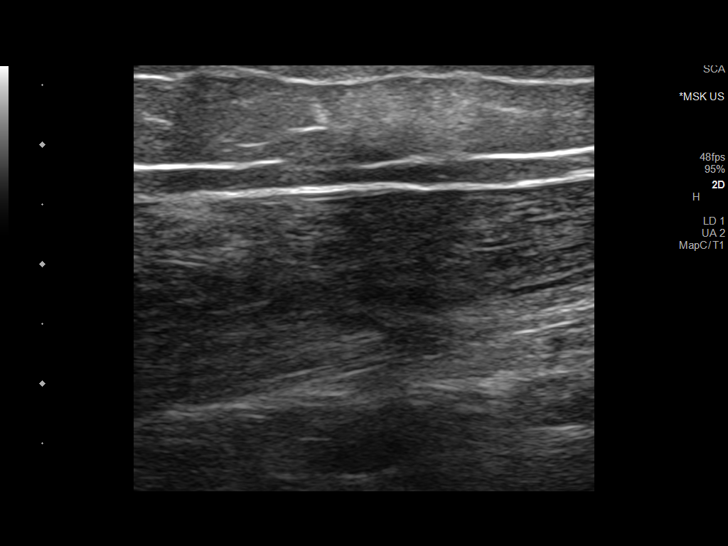
[im 10/16]
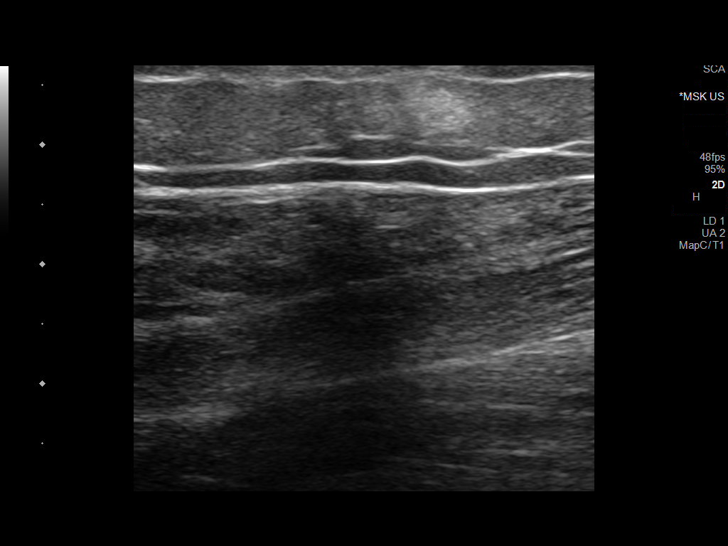
[im 11/16]
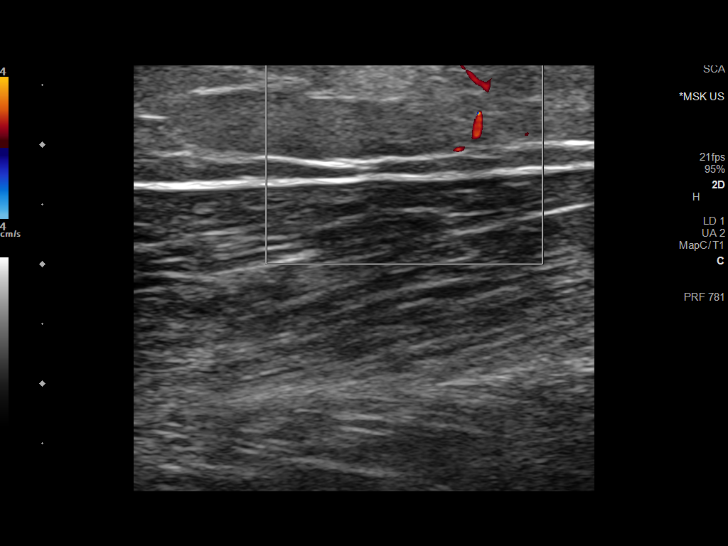
[im 13/16]
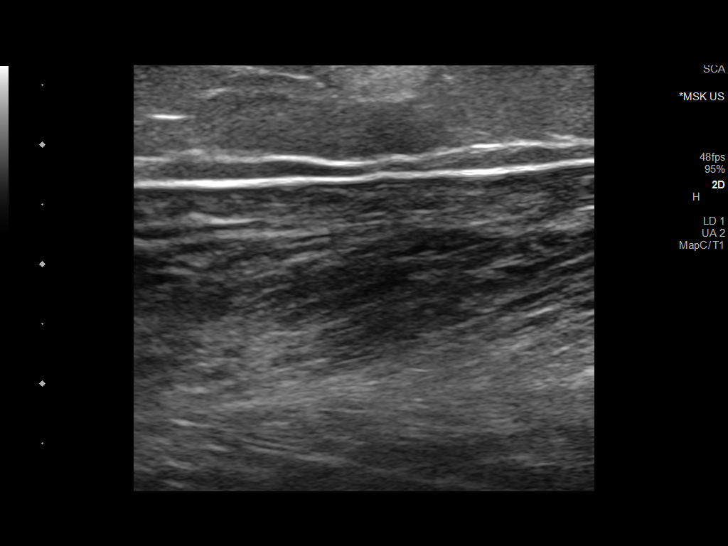
[im 14/16]
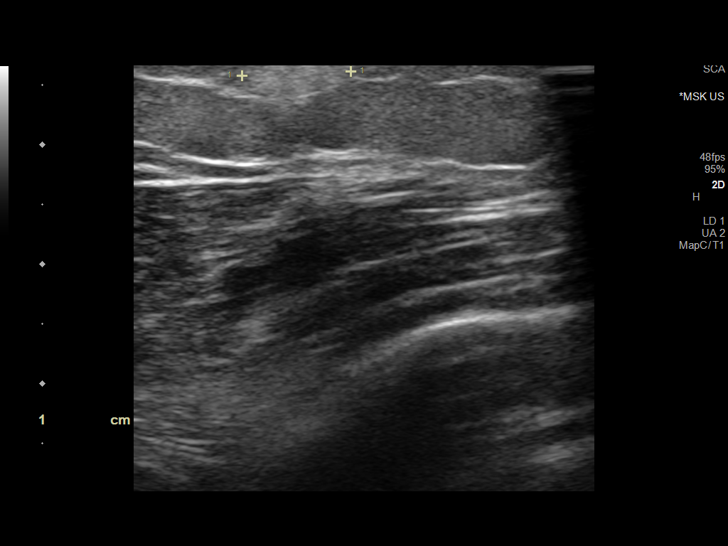
[im 15/16]
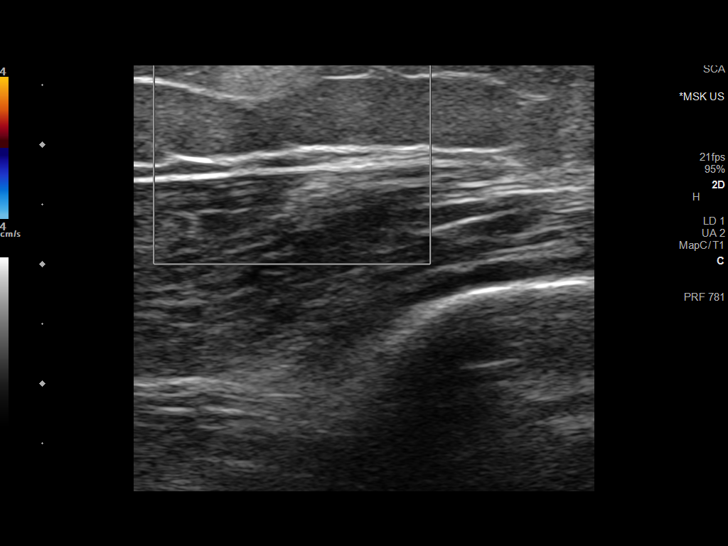
[im 16/16]
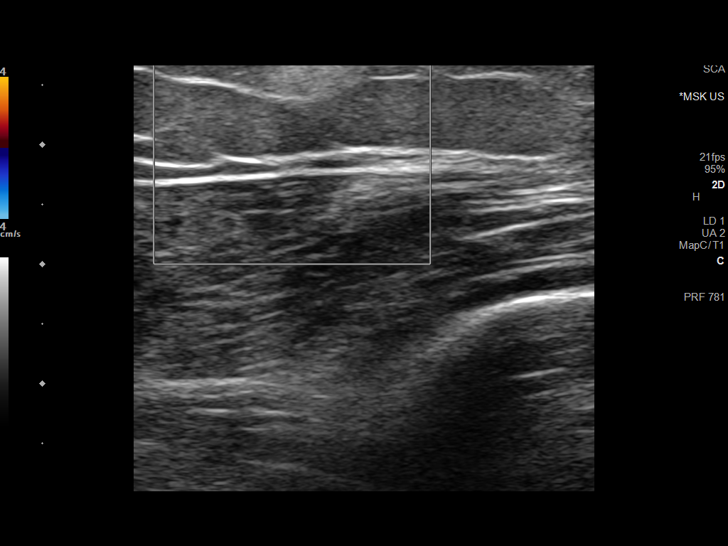

[14 of 16 positions shown; findings below may reference images not displayed]

FINDINGS: A 0.9 x 0.9 x 0.5 cm well-circumscribed hyperechoic nodule noted in
the right lower extremity in the region of palpable abnormality.
This is most likely a small lipoma. Confirmation with MRI can be
obtained as needed.
IMPRESSION: 0.9 x 0.9 x 0.5 cm hyperechoic nodule in the region of palpable
abnormality in the right lower extremity. This is most likely a
small lipoma.

## 2020-08-20 ENCOUNTER — Telehealth: Payer: Self-pay | Admitting: Family Medicine

## 2020-08-20 NOTE — Telephone Encounter (Signed)
Received requested info from [place] at [facility]

## 2020-08-28 ENCOUNTER — Other Ambulatory Visit: Payer: Self-pay

## 2020-08-28 ENCOUNTER — Encounter: Payer: Self-pay | Admitting: Family Medicine

## 2020-08-28 ENCOUNTER — Ambulatory Visit (INDEPENDENT_AMBULATORY_CARE_PROVIDER_SITE_OTHER): Payer: 59 | Admitting: Family Medicine

## 2020-08-28 VITALS — BP 108/68 | HR 72 | Ht 69.75 in | Wt 170.0 lb

## 2020-08-28 DIAGNOSIS — M545 Low back pain, unspecified: Secondary | ICD-10-CM | POA: Diagnosis not present

## 2020-08-28 DIAGNOSIS — R1030 Lower abdominal pain, unspecified: Secondary | ICD-10-CM | POA: Diagnosis not present

## 2020-08-28 DIAGNOSIS — N940 Mittelschmerz: Secondary | ICD-10-CM | POA: Diagnosis not present

## 2020-08-28 DIAGNOSIS — N898 Other specified noninflammatory disorders of vagina: Secondary | ICD-10-CM

## 2020-08-28 DIAGNOSIS — B373 Candidiasis of vulva and vagina: Secondary | ICD-10-CM | POA: Diagnosis not present

## 2020-08-28 DIAGNOSIS — B3731 Acute candidiasis of vulva and vagina: Secondary | ICD-10-CM

## 2020-08-28 LAB — POCT URINALYSIS DIP (PROADVANTAGE DEVICE)
Bilirubin, UA: NEGATIVE
Blood, UA: NEGATIVE
Glucose, UA: NEGATIVE mg/dL
Ketones, POC UA: NEGATIVE mg/dL
Nitrite, UA: NEGATIVE
Protein Ur, POC: NEGATIVE mg/dL
Specific Gravity, Urine: 1.025
Urobilinogen, Ur: NEGATIVE
pH, UA: 6 (ref 5.0–8.0)

## 2020-08-28 LAB — POCT WET PREP (WET MOUNT)
Clue Cells Wet Prep Whiff POC: NEGATIVE
Trichomonas Wet Prep HPF POC: ABSENT

## 2020-08-28 MED ORDER — FLUCONAZOLE 150 MG PO TABS: 150.0000 mg | ORAL_TABLET | Freq: Once | ORAL | 0 refills | Status: AC

## 2020-08-28 NOTE — Patient Instructions (Addendum)
  Timing-wise you may be ovulating, which can sometimes be painful for some women. You were tender at your right ovary. Use ibuprofen or aleve as needed for pain or cramping. Heating pad might also help (for your back also).  Take the diflucan for the yeast infection.  Your urine was very concentrated--be sure to drink more water!   Mittelschmerz Mittelschmerz is a condition that affects women. It is pain in the abdomen that occurs between periods. The pain in this condition: Affects one side of the abdomen. The side that it affects may change from month to month. May be mild or severe. May last from minutes to hours. It does not last longer than 1-2 days. Can happen with nausea and light vaginal bleeding. Mittelschmerz is caused by the growth and release of an egg from an ovary, and it is a natural part of the ovulation cycle. It often happens about 2 weeksafter a woman's period ends. Follow these instructions at home: Pay attention to any changes in your condition. Let your health care provider know about them. Take these actions to help with your pain: Try soaking in a hot bath. Try a heating pad to relax the muscles in the abdomen. Take over-the-counter and prescription medicines only as told by your health care provider. Keep all follow-up visits as told by your health care provider. This is important. Contact a health care provider if: You have very bad pain most months. You have abdominal pain that lasts longer than 24 hours. Your pain medicine is not helping. You have a fever. You have nausea or vomiting that will not go away. You miss your period. You have vaginal bleeding between your periods that is heavier than spotting. Summary Mittelschmerz is a condition that affects women. It is pain in the abdomen that occurs between periods. Mittelschmerz is caused by the growth and release of an egg from an ovary, and it is a natural part of the ovulation cycle. The pain in this  condition may affect one side of the abdomen, may be mild or severe, or may cause nausea. To relieve your pain, try soaking in a hot bath, using a heating pad, or taking prescription or over-the-counter medicines. Contact a health care provider if you have pain most months of the year, your pain lasts more than 24 hours, your pain medicine is not helping, or you have vaginal bleeding that is heavier than spotting. This information is not intended to replace advice given to you by your health care provider. Make sure you discuss any questions you have with your healthcare provider. Document Revised: 08/17/2017 Document Reviewed: 08/17/2017 Elsevier Patient Education  2022 [employer].

## 2020-08-28 NOTE — Progress Notes (Signed)
Chief Complaint  Patient presents with   Back Pain    LBP and soreness, lower abdominal pain, some vaginal itching, no vaginal discharge. Has some dryness and feels uncomfortable.     She started 3 days ago with some LBP and lower abdominal pain, which has been a little worse in the last couple of days. She describes the pain as a cramping, across the lower abdomen and on the right side.  She is also complaining of vaginal discharge and itching x 2 days.  She feels dry, itchy. The discharge is a small amount, white and thick. She has h/o frequent yeast infection, used to get every few months.  She hasn't had one in a year.  She used to use diflucan with good results.  She denies dysuria, urgency/frequency Stream feels a little slower.    PMH, PSH, SH reviewed  Outpatient Encounter Medications as of 08/28/2020  Medication Sig Note   buPROPion (WELLBUTRIN XL) 300 MG 24 hr tablet bupropion HCl XL 300 mg 24 hr tablet, extended release    cholecalciferol (VITAMIN D3) 25 MCG (1000 UNIT) tablet Take 1,000 Units by mouth daily.    LYSINE ACETATE PO Take 1 tablet by mouth daily.    oxybutynin (DITROPAN) 5 MG tablet Take 5 mg by mouth at bedtime. 07/22/2020: Takes qHS   Peppermint Oil (IBGARD PO) Take 1 tablet by mouth daily.    [DISCONTINUED] doxycycline (VIBRAMYCIN) 100 MG capsule Take 100 mg by mouth 2 (two) times daily. 07/22/2020: For peri-oral dermatitis from derm (almost finished with course)   No facility-administered encounter medications on file as of 08/28/2020.   ROS: no fever, chills, URI symptoms, headaches, dizziness, urinary complaints.  No n/v/d. +LBP, abd pain, and vaginal discharge per HPI. LMP 6/28, husband had vasectomy   PHYSICAL EXAM:  BP 108/68   Pulse 72   Ht 5' 9.75" (1.772 m)   Wt 170 lb (77.1 kg)   LMP 08/13/2020   Breastfeeding No   BMI 24.57 kg/m   Well-appearing, pleasant female, in no distress HEENT: conjunctiva and sclera are clear, EOMI, wearing  mask Neck: no lymphadenopathy or mass Heart: regular rate and rhythm Lungs: clear bilaterally Back: no spinal or CVA tenderness. Mildly tender at lower R lumbar paraspinous muscles. Abdomen: soft, normal bowel sounds, no organomegaly or mass. At first, had tenderness as an area of gas was palpated and moved across abdomen.  Otherwise, no significant focal tenderness, no mass GU exam: normal external genitalia without lesions There is mild-mod thick white discharge in vaginal vault. Mildly tender at R adnexa. Normal uterus, no CMT, nontender elsewhere Extremities: no edema  KOH--a few hyphae, no PMN's, no bacterial, no clue cells or trich Urine dip: SG 1.025, small leuks   ASSESSMENT/PLAN:  Yeast vaginitis - treat with diflucan - Plan: fluconazole (DIFLUCAN) 150 MG tablet  Low back pain, unspecified back pain laterality, unspecified chronicity, unspecified whether sciatica present - muscular component--trial of heating pad and OTC NSAID. Reassured. - Plan: POCT Urinalysis DIP (Proadvantage Device)  Lower abdominal pain - Plan: POCT Urinalysis DIP (Proadvantage Device)  Mittelschmerz - suspected based on tenderness of R adnexa (without mass), and being mid-cycle. NSAIDs, heating pad prn  Vaginal discharge - Plan: POCT Wet Prep [facility])  If persistent/worsening of pelvic pain, may need pelvic US. Can f/u here vs with her GYN   I spent 35 minutes dedicated to the care of this patient, including pre-visit review of records, face to face time, post-visit ordering of testing  and documentation.

## 2020-09-04 ENCOUNTER — Ambulatory Visit
Admission: RE | Admit: 2020-09-04 | Discharge: 2020-09-04 | Disposition: A | Discharge status: Home / Self Care | Payer: 59 | Source: Ambulatory Visit | Attending: Family Medicine | Admitting: Family Medicine

## 2020-09-04 ENCOUNTER — Other Ambulatory Visit: Payer: Self-pay

## 2020-09-04 ENCOUNTER — Ambulatory Visit: Admission: RE | Admit: 2020-09-04 | Payer: Managed Care, Other (non HMO) | Source: Ambulatory Visit

## 2020-09-04 DIAGNOSIS — N644 Mastodynia: Secondary | ICD-10-CM

## 2020-11-20 ENCOUNTER — Telehealth: Payer: Self-pay

## 2020-11-20 NOTE — Telephone Encounter (Signed)
Pt. Called wanting to schedule an apt. With you for a PAP only. She said she used to get them done at [facility] but it has been a few years. I told her I would have to check with you first to get the ok before scheduling that apt. If ok with you I can call pt. Back to schedule apt.

## 2020-11-20 NOTE — Telephone Encounter (Signed)
At her physical in June, she stated she was due to see GYN in November.  We never got any records or prior paps from her GYN.  I need the GYN records before scheduling her. And it isn't "pap only", it is GYN exam, which is breast and pelvic, and needs to be 30 min visit.  Do not schedule until records have been received (not truly sure when she is due).

## 2020-11-21 NOTE — Telephone Encounter (Signed)
I actually want GYN notes/records, not just the last pap

## 2020-11-25 ENCOUNTER — Encounter: Payer: Self-pay | Admitting: *Deleted

## 2020-11-28 ENCOUNTER — Telehealth: Payer: Self-pay | Admitting: Family Medicine

## 2020-11-28 ENCOUNTER — Encounter: Payer: Self-pay | Admitting: Family Medicine

## 2020-11-28 ENCOUNTER — Ambulatory Visit: Payer: 59 | Admitting: Family Medicine

## 2020-11-28 ENCOUNTER — Other Ambulatory Visit: Payer: Self-pay

## 2020-11-28 VITALS — BP 118/72 | HR 68 | Temp 98.6°F | Ht 69.75 in | Wt 174.4 lb

## 2020-11-28 DIAGNOSIS — R300 Dysuria: Secondary | ICD-10-CM

## 2020-11-28 DIAGNOSIS — N3000 Acute cystitis without hematuria: Secondary | ICD-10-CM

## 2020-11-28 DIAGNOSIS — R350 Frequency of micturition: Secondary | ICD-10-CM | POA: Diagnosis not present

## 2020-11-28 LAB — POCT URINALYSIS DIP (PROADVANTAGE DEVICE)
Bilirubin, UA: NEGATIVE
Blood, UA: NEGATIVE
Glucose, UA: NEGATIVE mg/dL
Ketones, POC UA: NEGATIVE mg/dL
Nitrite, UA: NEGATIVE
Protein Ur, POC: NEGATIVE mg/dL
Specific Gravity, Urine: 1.01
Urobilinogen, Ur: NEGATIVE
pH, UA: 6 (ref 5.0–8.0)

## 2020-11-28 MED ORDER — NITROFURANTOIN MONOHYD MACRO 100 MG PO CAPS: 100.0000 mg | ORAL_CAPSULE | Freq: Two times a day (BID) | ORAL | 0 refills | Status: DC

## 2020-11-28 MED ORDER — FLUCONAZOLE 150 MG PO TABS: ORAL_TABLET | ORAL | 0 refills | Status: DC

## 2020-11-28 NOTE — Progress Notes (Signed)
Chief Complaint  Patient presents with   Dysuria    Hurts when she has to urinates x 3 days as well as frequency. Burning with urination as well.    3 days ago she noted pain with urination in the morning.  Symptoms have progressed/worsened over the last day or two.  It burns when she void. Has slight cramping near vagina and lower pelvis, denies abdominal pain. Sometimes she voids large amounts, other times she has urgency and frequency, with small volumes.  She hasn't had a bladder infection in a long time. She does get frequent yeast infections.  PMH, PSH, SH reviewed  Outpatient Encounter Medications as of 11/28/2020  Medication Sig Note   buPROPion (WELLBUTRIN XL) 300 MG 24 hr tablet bupropion HCl XL 300 mg 24 hr tablet, extended release    cholecalciferol (VITAMIN D3) 25 MCG (1000 UNIT) tablet Take 1,000 Units by mouth daily.    fluconazole (DIFLUCAN) 150 MG tablet Take 1 tablet by mouth for yeast infection.  Repeat in 1 week if needed    ibuprofen (ADVIL) 200 MG tablet Take 400 mg by mouth every 6 (six) hours as needed. 11/28/2020: Last dose 7:30 am.   LYSINE ACETATE PO Take 1 tablet by mouth daily.    nitrofurantoin, macrocrystal-monohydrate, (MACROBID) 100 MG capsule Take 1 capsule (100 mg total) by mouth 2 (two) times daily.    oxybutynin (DITROPAN) 5 MG tablet Take 5 mg by mouth at bedtime. 07/22/2020: Takes qHS   Peppermint Oil (IBGARD PO) Take 1 tablet by mouth daily. (Patient not taking: Reported on 11/28/2020)    No facility-administered encounter medications on file as of 11/28/2020.   NOT taking macrobid or diflucan prior to today's visit  No Known Allergies  ROS: no fever, chills, URI symptoms, headache, dizziness, back pain, vaginal discharge, abnormal vaginal bleeding, abdominal pain, nausea or vomiting.    PHYSICAL EXAM:  BP 118/72   Pulse 68   Temp 98.6 F (37 C) (Tympanic)   Ht 5' 9.75" (1.772 m)   Wt 174 lb 6.4 oz (79.1 kg)   LMP 11/06/2020   BMI 25.20  kg/m   Pleasant female in no distress HEENT: conjunctiva and sclera are clear, EOMI, wearing mask Neck: no lymphadenopathy Heart; regular rate and rhythm Lungs: clear bilaterally Abdomen: Mild suprapubic tenderness Back: No CVA tenderness  Urine: 1+ leuks, SG 1.010   ASSESSMENT/PLAN:  Acute cystitis without hematuria - hydrate, ABX. Discussed voiding after intercourse, and f/u if sx persist/worsen.  Culture sent - Plan: Urine Culture, nitrofurantoin, macrocrystal-monohydrate, (MACROBID) 100 MG capsule  Urinary frequency - Plan: POCT Urinalysis DIP (Proadvantage Device)  Dysuria - Plan: POCT Urinalysis DIP (Proadvantage Device)  Diflucan prescribed to use if yeast infection develops, 2 tablets, with 2nd dose at 1 week only if needed (vs keeping on hand for prn use).  She will have breast/pelvic exam at her next physical with me (not due for pap). Advised she can return sooner if having any concerns (breast/pelvic).

## 2020-11-28 NOTE — Telephone Encounter (Signed)
Pt called back to find out about appt requested. Per [facility] records came in. Please advise pt as to how needs to be scheduled. Also, pt is coming in today for a possible bladder infection.

## 2020-12-01 LAB — URINE CULTURE

## 2021-03-04 DIAGNOSIS — L718 Other rosacea: Secondary | ICD-10-CM | POA: Diagnosis not present

## 2021-03-19 DIAGNOSIS — S92911A Unspecified fracture of right toe(s), initial encounter for closed fracture: Secondary | ICD-10-CM

## 2021-03-19 HISTORY — DX: Unspecified fracture of right toe(s), initial encounter for closed fracture: S92.911A

## 2021-04-04 ENCOUNTER — Telehealth: Payer: Self-pay

## 2021-04-04 DIAGNOSIS — M79671 Pain in right foot: Secondary | ICD-10-CM | POA: Diagnosis not present

## 2021-04-04 DIAGNOSIS — S92514A Nondisplaced fracture of proximal phalanx of right lesser toe(s), initial encounter for closed fracture: Secondary | ICD-10-CM | POA: Diagnosis not present

## 2021-04-04 MED ORDER — BUPROPION HCL ER (XL) 300 MG PO TB24: ORAL_TABLET | ORAL | 1 refills | Status: DC

## 2021-04-04 NOTE — Telephone Encounter (Signed)
Refill was sent to [facility].  Please try and document the preferred pharmacy in message.  We have only sent acute rx's, which may not be the same as where more chronic meds go.  Hopefully [facility] is where she wanted it.

## 2021-04-04 NOTE — Telephone Encounter (Signed)
Pt states previous practice no longer filling Wellbutrin XL 300mg  because she has transferred care to you and she needs refill

## 2021-04-07 NOTE — Telephone Encounter (Signed)
I forgot to note that pt had left a message, requesting the refill, I didn't speak to her.

## 2021-04-16 ENCOUNTER — Ambulatory Visit (INDEPENDENT_AMBULATORY_CARE_PROVIDER_SITE_OTHER): Payer: BC Managed Care – PPO | Admitting: Medical

## 2021-04-16 ENCOUNTER — Other Ambulatory Visit: Payer: Self-pay

## 2021-04-16 VITALS — BP 110/70 | HR 74 | Temp 97.8°F | Wt 177.2 lb

## 2021-04-16 DIAGNOSIS — H938X3 Other specified disorders of ear, bilateral: Secondary | ICD-10-CM | POA: Diagnosis not present

## 2021-04-16 DIAGNOSIS — H6503 Acute serous otitis media, bilateral: Secondary | ICD-10-CM | POA: Diagnosis not present

## 2021-04-16 MED ORDER — AMOXICILLIN 875 MG PO TABS: 875.0000 mg | ORAL_TABLET | Freq: Two times a day (BID) | ORAL | 0 refills | Status: AC

## 2021-04-16 NOTE — Progress Notes (Signed)
Subjective: ? Crystal Wilkerson is a 41 y.o. female who presents for  ?Chief Complaint  ?Patient presents with  ? left ear pain  ?  Left ear pain. Feels like fluid in it. Feels like its draining for 3 weeks  ? ?Here for left ear issue.  Has had trouble with ears in past.  For the past 3-4 weeks feels like left ear is drainage, but nothing comes out.  Tried swimmers drops but that didn't help.  This makes 3+ weeks of drainage.  Has a 1/10 discomfort. Has some dizziness.  ? ?No cough, no sore throat, no runny nose.  No fever, no body aches , no chills.  No nausea, no vomiting.   No sick contacts.  Her children are in preschool but no recent illness other than covid illness 2 months ago.  ? ?Patient is not a smoker. ? ?No other aggravating or relieving factors.  No other complaint.   ? ?Past Medical History:  ?Diagnosis Date  ? Anemia   ? Anxiety   ? Asthma   ? mild on exertion  ? Depression   ? hx of PPD, doing well  ? Maternal iron deficiency anemia affecting pregnancy in third trimester, antepartum 12/26/2015  ? Multiple sclerosis (HCC)   ? Postpartum care following vaginal delivery (11/10) 12/27/2015  ? UTI (lower urinary tract infection)   ? ? ?Current Outpatient Medications on File Prior to Visit  ?Medication Sig Dispense Refill  ? buPROPion (WELLBUTRIN XL) 300 MG 24 hr tablet Take one tablet by mouth once daily 90 tablet 1  ? cholecalciferol (VITAMIN D3) 25 MCG (1000 UNIT) tablet Take 1,000 Units by mouth daily.    ? ibuprofen (ADVIL) 200 MG tablet Take 400 mg by mouth every 6 (six) hours as needed.    ? LYSINE ACETATE PO Take 1 tablet by mouth daily.    ? oxybutynin (DITROPAN) 5 MG tablet Take 5 mg by mouth at bedtime.    ? Peppermint Oil (IBGARD PO) Take 1 tablet by mouth daily.    ? ?No current facility-administered medications on file prior to visit.  ? ? ?ROS as in subjective ? ? ?Objective: ?BP 110/70   Pulse 74   Temp 97.8 ?F (36.6 ?C)   Wt 177 lb 3.2 oz (80.4 kg)   BMI 25.61 kg/m?  ? ?General appearance:  Alert, well developed, well nourished, no distress ?                            Skin: warm, no rash ?                          Head: normocephalic ?                           Eyes: conjunctiva pink, corneas clear ?                           Ears: bilat TMs with serous fluid and air fluid levels, no erythema, external ear canals normal ?                         Nose: septum midline, turbinates swollen, with erythema and clear discharge ?            Mouth/throat: MMM, tongue normal, no pharyngeal erythema ?  Neck: supple, no adenopathy, no thyromegaly, non tender ?                  ?    ?  ? ?Assessment  ?Encounter Diagnoses  ?Name Primary?  ? Non-recurrent acute serous otitis media of both ears Yes  ? Ear pressure, bilateral   ? ? ?  ?Plan: ?Discussed diagnosis and treatment.   Discussed usual time frame to see improvement. Discussed possible complications or symptoms that would prompt call back or recheck within the next few days.    ? ?Begin decongestant and or Flonase, good hydration, swallowing, and if not much improvement within 3-5 days, can begin Amoxicillin.   ? ? ?Crystal Wilkerson was seen today for left ear pain. ? ?Diagnoses and all orders for this visit: ? ?Non-recurrent acute serous otitis media of both ears ? ?Ear pressure, bilateral ? ?Other orders ?-     amoxicillin (AMOXIL) 875 MG tablet; Take 1 tablet (875 mg total) by mouth 2 (two) times daily for 10 days. ? ? ? ?Patient was advised to call or recheck if worse or not improving.   ? ?Patient voiced understanding of diagnosis, recommendations, and treatment plan. ? ? ?    ? ?   ?

## 2021-04-16 NOTE — Patient Instructions (Signed)
Recommendations: ?Good water intake, frequent swallowing ?Begin OTC decongestant such as sudafed or phenylephrine for 4-5 days ?Consider nasal saline flush ?Consider Flonase over the counter the next 1- 2 weeks ?If not seeing any improvement in the next 3-4 days, then begin Amoxicillin antibiotic ? ? ? ?Eustachian Tube Dysfunction ?Eustachian tube dysfunction refers to a condition in which a blockage develops in the narrow passage that connects the middle ear to the back of the nose (eustachian tube). The eustachian tube regulates air pressure in the middle ear by letting air move between the ear and nose. It also helps to drain fluid from the middle ear space. ?Eustachian tube dysfunction can affect one or both ears. When the eustachian tube does not function properly, air pressure, fluid, or both can build up in the middle ear. ?What are the causes? ?This condition occurs when the eustachian tube becomes blocked or cannot open normally. Common causes of this condition include: ?Ear infections. ?Colds and other infections that affect the nose, mouth, and throat (upper respiratory tract). ?Allergies. ?Irritation from cigarette smoke. ?Irritation from stomach acid coming up into the esophagus (gastroesophageal reflux). The esophagus is the part of the body that moves food from the mouth to the stomach. ?Sudden changes in air pressure, such as from descending in an airplane or scuba diving. ?Abnormal growths in the nose or throat, such as: ?Growths that line the nose (nasal polyps). ?Abnormal growth of cells (tumors). ?Enlarged tissue at the back of the throat (adenoids). ?What increases the risk? ?You are more likely to develop this condition if: ?You smoke. ?You are overweight. ?You are a child who has: ?Certain birth defects of the mouth, such as cleft palate. ?Large tonsils or adenoids. ?What are the signs or symptoms? ?Common symptoms of this condition include: ?A feeling of fullness in the ear. ?Ear  pain. ?Clicking or popping noises in the ear. ?Ringing in the ear (tinnitus). ?Hearing loss. ?Loss of balance. ?Dizziness. ?Symptoms may get worse when the air pressure around you changes, such as when you travel to an area of high elevation, fly on an airplane, or go scuba diving. ?How is this diagnosed? ?This condition may be diagnosed based on: ?Your symptoms. ?A physical exam of your ears, nose, and throat. ?Tests, such as those that measure: ?The movement of your eardrum. ?Your hearing (audiometry). ?How is this treated? ?Treatment depends on the cause and severity of your condition. ?In mild cases, you may relieve your symptoms by moving air into your ears. This is called "popping the ears." ?In more severe cases, or if you have symptoms of fluid in your ears, treatment may include: ?Medicines to relieve congestion (decongestants). ?Medicines that treat allergies (antihistamines). ?Nasal sprays or ear drops that contain medicines that reduce swelling (steroids). ?A procedure to drain the fluid in your eardrum. In this procedure, a small tube may be placed in the eardrum to: ?Drain the fluid. ?Restore the air in the middle ear space. ?A procedure to insert a balloon device through the nose to inflate the opening of the eustachian tube (balloon dilation). ?Follow these instructions at home: ?Lifestyle ?Do not do any of the following until your health care provider approves: ?Travel to high altitudes. ?Fly in airplanes. ?Work in a Estate agent or room. ?Scuba dive. ?Do not use any products that contain nicotine or tobacco. These products include cigarettes, chewing tobacco, and vaping devices, such as e-cigarettes. If you need help quitting, ask your health care provider. ?Keep your ears dry. Wear fitted earplugs  during showering and bathing. Dry your ears completely after. ?General instructions ?Take over-the-counter and prescription medicines only as told by your health care provider. ?Use techniques to  help pop your ears as recommended by your health care provider. These may include: ?Chewing gum. ?Yawning. ?Frequent, forceful swallowing. ?Closing your mouth, holding your nose closed, and gently blowing as if you are trying to blow air out of your nose. ?Keep all follow-up visits. This is important. ?Contact a health care provider if: ?Your symptoms do not go away after treatment. ?Your symptoms come back after treatment. ?You are unable to pop your ears. ?You have: ?A fever. ?Pain in your ear. ?Pain in your head or neck. ?Fluid draining from your ear. ?Your hearing suddenly changes. ?You become very dizzy. ?You lose your balance. ?Get help right away if: ?You have a sudden, severe increase in any of your symptoms. ?Summary ?Eustachian tube dysfunction refers to a condition in which a blockage develops in the eustachian tube. ?It can be caused by ear infections, allergies, inhaled irritants, or abnormal growths in the nose or throat. ?Symptoms may include ear pain or fullness, hearing loss, or ringing in the ears. ?Mild cases are treated with techniques to unblock the ears, such as yawning or chewing gum. ?More severe cases are treated with medicines or procedures. ?This information is not intended to replace advice given to you by your health care provider. Make sure you discuss any questions you have with your health care provider. ?Document Revised: 04/15/2020 Document Reviewed: 04/15/2020 ?Elsevier Patient Education ? 2022 [employer] ? ?

## 2021-05-06 ENCOUNTER — Other Ambulatory Visit: Payer: Self-pay | Admitting: Medical

## 2021-05-06 ENCOUNTER — Telehealth: Payer: Self-pay

## 2021-05-06 MED ORDER — FLUCONAZOLE 150 MG PO TABS: 150.0000 mg | ORAL_TABLET | Freq: Once | ORAL | 0 refills | Status: AC

## 2021-05-06 NOTE — Telephone Encounter (Signed)
Pt. Called stating that she saw you recently for an ear infection and you called in some anti biotics for her. She stats she forgot to ask you if you could call in some diflucan for her because every time she takes an antibiotic she gets a yeast infection and that has happened again now.  ?

## 2021-05-16 ENCOUNTER — Telehealth: Payer: Self-pay

## 2021-05-16 NOTE — Telephone Encounter (Signed)
Pt. Called LM stating that her ears are still in bad shape and wanted to know if she could get another round of amoxicllin called in. She still has pain in ears and feels stopped up.  ?

## 2021-05-19 ENCOUNTER — Other Ambulatory Visit: Payer: Self-pay | Admitting: Medical

## 2021-05-19 MED ORDER — AMOXICILLIN-POT CLAVULANATE 875-125 MG PO TABS: 1.0000 | ORAL_TABLET | Freq: Two times a day (BID) | ORAL | 0 refills | Status: DC

## 2021-05-19 NOTE — Telephone Encounter (Signed)
Pt was notified.  

## 2021-06-01 NOTE — Progress Notes (Signed)
?Chief Complaint  ?Patient presents with  ? Vaginal Itching  ?  Has been having vaginal itchiness and discomfort. Was on amox for double ear infection. Audelia Acton gave her diflucan x 2. Still having some itching and discharge. Has been wearing period underwear due to incontinence-wonders if this has something to do with her symptoms. Also ears don't feel 100% and she would like to know if you can take a look. Donated plasma on Monday, just wanted you to know.   ? ? ?She saw Audelia Acton 04/16/21 with complaints of L ear pain, serous otitis. ?Plan was decongestant and/or Flonase, and if not much improvement within 3-5 days, can begin Amoxicillin.  Decongestant helped some, but the next day she woke up with both ears "draining", so she started the antibiotic. She no longer has ear discomfort (just when windy and walking the other day). She feels like there is still some drainage--denies any liquid actually coming out, feels like there is water in her ears.  Hearing is normal.  She denies any allergy symptoms. ? ?A week later she started with vaginal discharge and itching. ?She took diflucan x 2.  Last dose was approx 3/31.  She felt better after the first dose, symptoms recurred so she took the 2nd dose (a week later).  Symptoms improved, but has persistent itchiness. Discharge is intermittent, none today. ?She now has an external area on the L labia that is very itchy, even when she has no discharge. ?Feels like her urinary incontinence is worse when she has a yeast infection.  She has chronic incontinence. She has been wearing period underwear which has been life-changing. She changes it frequently when wet. Denies dysuria or abdominal pain. ? ?Pt denies risk or h/o STD's. Denies pelvic pain. ? ? ?PMH, PSH, SH reviewed ? ?Outpatient Encounter Medications as of 06/02/2021  ?Medication Sig Note  ? buPROPion (WELLBUTRIN XL) 300 MG 24 hr tablet Take one tablet by mouth once daily   ? cholecalciferol (VITAMIN D3) 25 MCG (1000 UNIT)  tablet Take 1,000 Units by mouth daily.   ? LYSINE ACETATE PO Take 1 tablet by mouth daily.   ? oxybutynin (DITROPAN) 5 MG tablet Take 5 mg by mouth at bedtime. 07/22/2020: Takes qHS  ? ibuprofen (ADVIL) 200 MG tablet Take 400 mg by mouth every 6 (six) hours as needed. (Patient not taking: Reported on 06/02/2021) 06/02/2021: As needed  ? [DISCONTINUED] amoxicillin-clavulanate (AUGMENTIN) 875-125 MG tablet Take 1 tablet by mouth every 12 (twelve) hours.   ? [DISCONTINUED] Peppermint Oil (IBGARD PO) Take 1 tablet by mouth daily.   ? ?No facility-administered encounter medications on file as of 06/02/2021.  ? ?No Known Allergies ? ? ?ROS: Denies fever, chills, URI symptoms, headaches, dizziness (only during recent plasma donation), shortness of breath, chest pain.  Denies nausea, vomiting, bowel changes, urinary complaints, bleeding, bruising, rash. ?Vaginal discharge and itching per HPI ?Ear discomfort per HPI ? ? ? ?PHYSICAL EXAM: ? ?BP 116/72   Pulse 68   Ht 5' 9.75" (1.772 m)   Wt 180 lb 6.4 oz (81.8 kg)   LMP 05/15/2021 (Approximate)   BMI 26.07 kg/m?  ? ?Wt Readings from Last 3 Encounters:  ?06/02/21 180 lb 6.4 oz (81.8 kg)  ?04/16/21 177 lb 3.2 oz (80.4 kg)  ?11/28/20 174 lb 6.4 oz (79.1 kg)  ? ?Well-appearing, pleasant female in no distress ?HEENT: conjunctiva and sclera are clear, EOMI. ?Nasal mucosa is mildly edematous, pale, no drainage. Sinuses nontender ?TM's and EAC's are normal bilaterally.  OP is clear ?Neck: no lymphadenopathy or mass ?Heart: regular rate and rhythm, no murmur ?Lungs: clear bilaterally ?Abdomen: soft, nontender ?GU: There are some scattered small purple dots bilaterally (reportedly remnants of large blood blisters she had)--slightly swollen on the left (the soft tissue). ?No erythema, ulcers, lesions. Cervix appears normal.  There is mucoid, white discharge in the vagina. ? ?KOH negative for yeast ?Wet prep--no clue cells, no trich.  Many PMN's, but no significant  bacteria ? ? ? ?ASSESSMENT/PLAN: ? ?Vaginal itching - this is external, no e/o yeast. Can try some OTC HC cream prn.   - Plan: POCT Wet Prep [facility]) ? ?Vaginal discharge - appears normal/physiologic, nothing to suggest yeast. Some PMN's, no bacteria (?) To f/u if persistent/worsening sx - Plan: POCT Wet Prep [facility]) ? ?Discomfort of both ears - reassured of normal exam. Given nasal findings, poss allergies. Can try antihistamine, vs sudafed prn ear discomfort ? ?

## 2021-06-02 ENCOUNTER — Ambulatory Visit (INDEPENDENT_AMBULATORY_CARE_PROVIDER_SITE_OTHER): Payer: BC Managed Care – PPO | Admitting: Family Medicine

## 2021-06-02 ENCOUNTER — Encounter: Payer: Self-pay | Admitting: Family Medicine

## 2021-06-02 VITALS — BP 116/72 | HR 68 | Ht 69.75 in | Wt 180.4 lb

## 2021-06-02 DIAGNOSIS — H9203 Otalgia, bilateral: Secondary | ICD-10-CM | POA: Diagnosis not present

## 2021-06-02 DIAGNOSIS — N898 Other specified noninflammatory disorders of vagina: Secondary | ICD-10-CM | POA: Diagnosis not present

## 2021-06-03 LAB — POCT WET PREP (WET MOUNT)
Clue Cells Wet Prep Whiff POC: NEGATIVE
Trichomonas Wet Prep HPF POC: ABSENT

## 2021-06-16 DIAGNOSIS — D239 Other benign neoplasm of skin, unspecified: Secondary | ICD-10-CM

## 2021-06-16 HISTORY — DX: Other benign neoplasm of skin, unspecified: D23.9

## 2021-07-08 DIAGNOSIS — L814 Other melanin hyperpigmentation: Secondary | ICD-10-CM | POA: Diagnosis not present

## 2021-07-08 DIAGNOSIS — D2271 Melanocytic nevi of right lower limb, including hip: Secondary | ICD-10-CM | POA: Diagnosis not present

## 2021-07-08 DIAGNOSIS — D485 Neoplasm of uncertain behavior of skin: Secondary | ICD-10-CM | POA: Diagnosis not present

## 2021-07-08 DIAGNOSIS — D2262 Melanocytic nevi of left upper limb, including shoulder: Secondary | ICD-10-CM | POA: Diagnosis not present

## 2021-07-08 DIAGNOSIS — D225 Melanocytic nevi of trunk: Secondary | ICD-10-CM | POA: Diagnosis not present

## 2021-07-17 ENCOUNTER — Encounter: Payer: Self-pay | Admitting: Family Medicine

## 2021-07-23 ENCOUNTER — Encounter: Payer: 59 | Admitting: Family Medicine

## 2021-07-30 DIAGNOSIS — D485 Neoplasm of uncertain behavior of skin: Secondary | ICD-10-CM | POA: Diagnosis not present

## 2021-07-30 DIAGNOSIS — L988 Other specified disorders of the skin and subcutaneous tissue: Secondary | ICD-10-CM | POA: Diagnosis not present

## 2021-08-05 ENCOUNTER — Encounter: Payer: Self-pay | Admitting: Family Medicine

## 2021-08-05 DIAGNOSIS — G379 Demyelinating disease of central nervous system, unspecified: Secondary | ICD-10-CM | POA: Insufficient documentation

## 2021-08-05 NOTE — Patient Instructions (Incomplete)
  HEALTH MAINTENANCE RECOMMENDATIONS:  It is recommended that you get at least 30 minutes of aerobic exercise at least 5 days/week (for weight loss, you may need as much as 60-90 minutes). This can be any activity that gets your heart rate up. This can be divided in 10-15 minute intervals if needed, but try and build up your endurance at least once a week.  Weight bearing exercise is also recommended twice weekly.  Eat a healthy diet with lots of vegetables, fruits and fiber.  "Colorful" foods have a lot of vitamins (ie green vegetables, tomatoes, red peppers, etc).  Limit sweet tea, regular sodas and alcoholic beverages, all of which has a lot of calories and sugar.  Up to 1 alcoholic drink daily may be beneficial for women (unless trying to lose weight, watch sugars).  Drink a lot of water.  Calcium recommendations are 1200-1500 mg daily (1500 mg for postmenopausal women or women without ovaries), and vitamin D 1000 IU daily.  This should be obtained from diet and/or supplements (vitamins), and calcium should not be taken all at once, but in divided doses.  Monthly self breast exams and yearly mammograms for women over the age of 24 is recommended.  Sunscreen of at least SPF 30 should be used on all sun-exposed parts of the skin when outside between the hours of 10 am and 4 pm (not just when at beach or pool, but even with exercise, golf, tennis, and yard work!)  Use a sunscreen that says "broad spectrum" so it covers both UVA and UVB rays, and make sure to reapply every 1-2 hours.  Remember to change the batteries in your smoke detectors when changing your clock times in the spring and fall. Carbon monoxide detectors are recommended for your home.  Use your seat belt every time you are in a car, and please drive safely and not be distracted with cell phones and texting while driving.  I encourage you to see your therapist for a "tune up". If moods aren't improving, we should consider adjusting  your medications.  Schedule a follow-up visit for this (can be a virtual visit).   Next mammogram is due when you are 41.  Try and get more aerobic exercise (daily). Watch your portions.  Slight weight loss would be good, but goal is to not gain further.  If you have ongoing issues with weight gain and fatigue, despite making changes to try and help this, then maybe we should recheck the thyroid.

## 2021-08-05 NOTE — Progress Notes (Signed)
Chief Complaint  Patient presents with   Annual Exam    Nonfasting annual exam, last time she saw [facility] was 2021 and she would like to do pap here if possible. Thinks last pap was 2020. Started period this am. Having more uncomfortable cycles, cramping, mood and low back pain. Has been more fatigued than usual.    Patient is a 41 year old female who presents for annual physical exam.  She notices her cycles have been a little different, more crampy.  She started her cycle today. She has noticed this since being off any hormonal therapy.  She had Mirena IUD prior to pregnancy.  Since 2020, she hasn't been on anything as her husband had a vasectomy.  Cycles remain regular, last 3-5 days.  She notices LBP and short-tempered just prior period starting. She also notes feeling "way more tired" than normal. This has been for about a year, feels like she is somewhat burned out from being home with the kids, describes it as tedious and monotonous. She feels like she is kind of in a funk.  She had normal labs last year, feels like the fatigue started prior to her visit/labs.  Patient has depression, with h/o postpartum depression.  She stayed on Wellbutrin after first child, lowered the dose, increased it back to $Remov'300mg'GCWMvg$  during the pandemic.  Starting to get in a bit of a funk.  She used to  see her therapist monthly for "tune-ups". Hasn't seen her in over 6 months. She was on a lot of medications as a teenager (as well as Adderall). She can't recall details about which medications were effective or if she had any side effects.  Has been on Wellbutrin for over 5 years.  As reported above, complaining of fatigue.  The baby gets up 1-2x/night.  She goes to bed 10:30-11, up at 5:30-6:30.  She is awake 2-3 times/night between baby and bathroom (even with husband helping, she gets woken up). She hasn't been told she snores. She believes depression contributes.  She had a dysplastic nevus removed from  RLE by Dr. Elvera Lennox earlier this month. Margin was involved, and underwent re-excision 6/14.  There was no residual dysplastic nevus.  She has suspected MS (clinically isolated syndrome, demyelinating disease noted on MRI, but no progression; if she has future events, would lead to dx of relapsing remitting MS per neuro). She last had virtual visit with neuro 06/2020, with MRI done 08/2020, see below. MRI 08/2020: CONCLUSION:  1.  Likely no significant change in supratentorial white matter disease when accounting for differences in imaging technique, presumably representing sequelae of prior demyelinating events. No new white matter lesions identified. No contrast material was administered for today's exam.  2.  No acute intracranial abnormality.   Diagnosis came related to testing for urinary issues. She was started on ditropan $RemoveBef'5mg'wogsFBlTZM$  qHS by neuro last year to help with nocturia. This has helped a lot--now gets up 1-2x/night, down from 6-7x.  She had initially noted improvement in her energy since she had fewer interruptions in her sleep.  Still has some fatigue as noted above.  She had stopped wearing the period underwear (see visits she had for irritation).  The irritation she was having has improved, but still has some.  Only has urinary leakage when running around with the kids, possibly with intercourse, or with a hard cough or laugh.   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  Walks daily ("leisurely stroll") with dogs, kids.   Lipid screen:  Lab Results  Component Value Date   CHOL 209 (H) 07/22/2020   HDL 83 07/22/2020   LDLCALC 115 (H) 07/22/2020   TRIG 62 07/22/2020   CHOLHDL 2.5 07/22/2020   Vitamin D-OH 35.5 in 07/2020.  Normal c-met, CBC, TSH 07/2020  PMH, PSH, SH and FH were reviewed and chart updated  Outpatient Encounter Medications as of 08/06/2021  Medication Sig Note   buPROPion (WELLBUTRIN XL) 300 MG 24 hr tablet Take one tablet by mouth once daily    cholecalciferol (VITAMIN D3) 25 MCG (1000 UNIT) tablet Take 1,000 Units by mouth daily.    LYSINE ACETATE PO Take 1 tablet by mouth daily.    Multiple Vitamins-Minerals (MULTIVITAMIN WITH MINERALS) tablet Take 1 tablet by mouth daily.    oxybutynin (DITROPAN) 5 MG tablet Take 5 mg by mouth at bedtime. 07/22/2020: Takes qHS   ibuprofen (ADVIL) 200 MG tablet Take 400 mg by mouth every 6 (six) hours as needed. (Patient not taking: Reported on 06/02/2021) 06/02/2021: As needed   No facility-administered encounter medications on file as of 08/06/2021.   Imodium prn diarrhea, none today.   No Known Allergies  ROS:  The patient denies anorexia, fever, headaches,  vision changes, decreased hearing, ear pain, sore throat, breast concerns, chest pain, palpitations, dizziness, syncope, dyspnea on exertion, cough, swelling, nausea, vomiting, constipation, abdominal pain, melena, hematochezia, indigestion/heartburn, hematuria,  dysuria, irregular menstrual cycles, vaginal discharge, odor or itch, genital lesions, joint pains, numbness, tingling, weakness, tremor, suspicious skin lesions, anxiety, abnormal bleeding/bruising, or enlarged lymph nodes.  IBS is stable.  Cutting down on coffee has helped (IB Guard has helped in past, too expensive).  Typical symptoms are diarrhea and cramping. Uses Imodium prn.  Not bad recently. Nocturia, improved with ditropan.  Some leakage during the day, see  HPI. Saw dermatologist recently.  Lost weight after baby in 2020 with WW.  She regained weight when she stopped.  When she tried it again, it made her feel bad about herself. Thinks her portions are too large, eating too much (appetizers, portions).  Husband also gained weight. Depression per HPI.    PHYSICAL EXAM:  BP 118/72   Pulse 64   Ht 5' 9.75" (1.772 m)   Wt 181 lb (82.1 kg)   LMP 08/06/2021 (Exact Date)   BMI 26.16 kg/m   Wt Readings from Last 3 Encounters:  08/06/21 181 lb (82.1 kg)  06/02/21 180 lb 6.4 oz (81.8 kg)  04/16/21 177 lb 3.2 oz (80.4 kg)  Wt 168# 07/2020   General Appearance:    Alert, cooperative, no distress, appears stated age  Head:    Normocephalic, without obvious abnormality, atraumatic  Eyes:    PERRL, conjunctiva/corneas clear, EOM's intact, fundi    benign  Ears:    Normal TM's and external ear canals  Nose:   Normal no drainage, sinuses nontender  Throat:   Normal, no lesions  Neck:   Supple, no lymphadenopathy;  thyroid:  no enlargement/ tenderness/nodules; no carotid bruit or JVD  Back:    Spine nontender, no curvature, ROM normal,  no CVA     tenderness  Lungs:     Clear to auscultation bilaterally without wheezes, rales or     ronchi; respirations unlabored  Chest Wall:    No tenderness or deformity   Heart:    Regular rate and rhythm, S1 and S2 normal, no murmur, rub   or gallop  Breast Exam:    No nipple discharge or inversion.  No breast masses, skin dimpling, tenderness or axillary lymphadenopathy.  Abdomen:     Soft, non-tender, nondistended, normoactive bowel sounds,    no masses, no hepatosplenomegaly  Genitalia:    Normal external genitalia without lesions. The punctate purple/blue papules on external genitalia/labia majora are much smaller than at last visit, not inflamed. BUS and vagina normal. No cervical motion tenderness, adnexal or uterine tenderness or masses  Rectal:   Exam not performed due to age <40 and no complaints   Extremities:   No clubbing, cyanosis or edema  Pulses:   2+ and symmetric all extremities  Skin:   Skin color, texture, turgor normal, no rashes. Linear burn at L anterior wrist (healing, without STS, crusting or e/o infection). Sutured incision at L lateral LE with erythema, swelling or signs of infection.  Abrasion above this area.  Lymph nodes:   Cervical, supraclavicular, axillary and inguinal nodes normal  Neurologic:   Normal strength, sensation and gait; reflexes 1+ and symmetric throughout          Psych:   Normal mood, affect, hygiene and grooming.        08/06/2021    1:43 PM 07/22/2020    9:49 AM  Depression screen PHQ 2/9  Decreased Interest 0 0  Down, Depressed, Hopeless 1 0  PHQ - 2 Score 1 0  Altered sleeping 1   Tired, decreased energy 2   Change in appetite 3   Feeling bad or failure about yourself  3   Trouble concentrating 3   Moving slowly or fidgety/restless 0   Suicidal thoughts 0   PHQ-9 Score 13   Difficult doing work/chores Very difficult     ASSESSMENT/PLAN:  Annual physical exam - Plan: POCT Urinalysis DIP (Proadvantage Device)  Depression, major, single episode, moderate (HCC) - Pt to resume counseling. Briefly discussed other med options--to f/u for med changes if not improving with therapy. Cont Wellbutrin  Clinically isolated syndrome (HCC)  Nocturia - significantly improved with ditropan, continue  Fatigue, unspecified type - ddx reviewed, suspect depression contributing. Nl labs last year. Encouraged regular exercise  BMI 26.0-26.9,adult - counseled re: healthy diet, portions, exercise, weight loss   Depression--suboptimally controlled.  Bad time of cycle currently; needs to resume more regular therapy.  To f/u if ongoing issues for medication adjustments. Counseled re: structure, activities out of the house. 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  Immunization recommendations discussed--continue yearly flu shots.  Updated bivalent COVID booster recommended when available (Fall). Colonoscopy recommendations reviewed, age 65.

## 2021-08-06 ENCOUNTER — Encounter: Payer: Self-pay | Admitting: Family Medicine

## 2021-08-06 ENCOUNTER — Ambulatory Visit (INDEPENDENT_AMBULATORY_CARE_PROVIDER_SITE_OTHER): Payer: BC Managed Care – PPO | Admitting: Family Medicine

## 2021-08-06 VITALS — BP 118/72 | HR 64 | Ht 69.75 in | Wt 181.0 lb

## 2021-08-06 DIAGNOSIS — F321 Major depressive disorder, single episode, moderate: Secondary | ICD-10-CM

## 2021-08-06 DIAGNOSIS — Z6826 Body mass index (BMI) 26.0-26.9, adult: Secondary | ICD-10-CM

## 2021-08-06 DIAGNOSIS — R5383 Other fatigue: Secondary | ICD-10-CM

## 2021-08-06 DIAGNOSIS — R351 Nocturia: Secondary | ICD-10-CM | POA: Diagnosis not present

## 2021-08-06 DIAGNOSIS — G379 Demyelinating disease of central nervous system, unspecified: Secondary | ICD-10-CM | POA: Diagnosis not present

## 2021-08-06 DIAGNOSIS — Z0000 Encounter for general adult medical examination without abnormal findings: Secondary | ICD-10-CM | POA: Diagnosis not present

## 2021-08-06 LAB — POCT URINALYSIS DIP (PROADVANTAGE DEVICE)
Bilirubin, UA: NEGATIVE
Glucose, UA: NEGATIVE mg/dL
Ketones, POC UA: NEGATIVE mg/dL
Leukocytes, UA: NEGATIVE
Nitrite, UA: NEGATIVE
Protein Ur, POC: NEGATIVE mg/dL
Specific Gravity, Urine: 1.02
Urobilinogen, Ur: NEGATIVE
pH, UA: 5 (ref 5.0–8.0)

## 2021-08-21 ENCOUNTER — Telehealth: Payer: Self-pay | Admitting: Family Medicine

## 2021-08-21 NOTE — Telephone Encounter (Signed)
Pt states she was supposed to have a virtual follow up to discuss medications. I didn't see any notes about this so just wanted to check with you before putting her on the schedule. Please advise

## 2021-08-21 NOTE — Telephone Encounter (Signed)
Yes, you can schedule her for 30 minute virtual. Thanks.

## 2021-08-24 NOTE — Progress Notes (Unsigned)
Start time: End time:  Virtual Visit via Video Note  I connected with Crystal Wilkerson on 08/24/21 by a video enabled telemedicine application and verified that I am speaking with the correct person using two identifiers.  Location: Patient: *** Provider: office   I discussed the limitations of evaluation and management by telemedicine and the availability of in person appointments. The patient expressed understanding and agreed to proceed.  History of Present Illness:  No chief complaint on file.   At visit last month, we discussed her depression-- Patient has depression, with h/o postpartum depression.  She stayed on Wellbutrin after first child, lowered the dose, increased it back to 300mg  during the pandemic.  At her physical she reported that she was starting to get in a bit of a funk.  She used to  see her therapist monthly for "tune-ups". Hadn't seen her in over 6 months. She was on a lot of medications as a teenager (as well as Adderall). She can't recall details about which medications were effective or if she had any side effects.  Has been on Wellbutrin for over 5 years.   She was also complaining of fatigue at her physical (baby gets up 1-2x/night.  She goes to bed 10:30-11, up at 5:30-6:30.  She is awake 2-3 times/night between baby and bathroom--even with husband helping, she gets woken up). She hasn't been told she snores. She believes depression contributes.  She was advised to restart therapy/counseling. Also discussed that if she was having ongoing issues with weight gain and fatigue, despite making changes to try and help this, that thyroid should be rechecked.    Observations/Objective:  LMP 08/06/2021 (Exact Date)    Assessment and Plan:   Follow Up Instructions:    I discussed the assessment and treatment plan with the patient. The patient was provided an opportunity to ask questions and all were answered. The patient agreed with the plan and demonstrated an  understanding of the instructions.   The patient was advised to call back or seek an in-person evaluation if the symptoms worsen or if the condition fails to improve as anticipated.  I spent *** minutes dedicated to the care of this patient, including pre-visit review of records, face to face time, post-visit ordering of testing and documentation.    08/08/2021, MD

## 2021-08-26 ENCOUNTER — Encounter: Payer: Self-pay | Admitting: Family Medicine

## 2021-08-26 ENCOUNTER — Telehealth (INDEPENDENT_AMBULATORY_CARE_PROVIDER_SITE_OTHER): Payer: BC Managed Care – PPO | Admitting: Family Medicine

## 2021-08-26 VITALS — Ht 69.75 in | Wt 180.0 lb

## 2021-08-26 DIAGNOSIS — F3281 Premenstrual dysphoric disorder: Secondary | ICD-10-CM | POA: Diagnosis not present

## 2021-08-26 MED ORDER — DROSPIRENONE-ETHINYL ESTRADIOL 3-0.02 MG PO TABS: 1.0000 | ORAL_TABLET | Freq: Every day | ORAL | 5 refills | Status: DC

## 2021-08-26 NOTE — Patient Instructions (Signed)
Start the Yaz birth control pills today. Take it once daly, and taking the 4 days of placebo monthly (1 pack/month). See how this does for you over the next 2 months.  If moods are improved, you can continue it monthly. If you continue to have worsening depression with your cycles, we can skip the placebo pills and cycle only every 3 months, to see how that works for you. If you don't get on a regular routine, and have a lot of abnormal bleeding, we may need to switch the type of birth control pill.  Keep track of any irregular bleeding on a calendar. It can take 2-3 months for your body to get adjusted to a new birth control pill.

## 2021-10-01 ENCOUNTER — Other Ambulatory Visit: Payer: Self-pay | Admitting: Family Medicine

## 2021-10-01 NOTE — Telephone Encounter (Signed)
Left message for patient to please call back and let me know if she needed this filled.

## 2021-10-02 NOTE — Telephone Encounter (Signed)
Left another message asking patient to please call back to let me know if she needs this refilled.

## 2021-10-22 ENCOUNTER — Encounter: Payer: Self-pay | Admitting: Internal Medicine

## 2021-12-03 ENCOUNTER — Encounter: Payer: 59 | Admitting: Family Medicine

## 2021-12-10 ENCOUNTER — Encounter: Payer: Self-pay | Admitting: Family Medicine

## 2021-12-10 ENCOUNTER — Ambulatory Visit (INDEPENDENT_AMBULATORY_CARE_PROVIDER_SITE_OTHER): Payer: BC Managed Care – PPO | Admitting: Family Medicine

## 2021-12-10 VITALS — BP 100/70 | HR 64 | Temp 97.7°F | Ht 69.75 in | Wt 170.4 lb

## 2021-12-10 DIAGNOSIS — J4541 Moderate persistent asthma with (acute) exacerbation: Secondary | ICD-10-CM

## 2021-12-10 MED ORDER — AZITHROMYCIN 250 MG PO TABS: ORAL_TABLET | ORAL | 0 refills | Status: DC

## 2021-12-10 MED ORDER — PREDNISONE 20 MG PO TABS: 20.0000 mg | ORAL_TABLET | Freq: Two times a day (BID) | ORAL | 0 refills | Status: DC

## 2021-12-10 NOTE — Progress Notes (Signed)
Chief Complaint  Patient presents with   Hospitalization Follow-up    Cough x 3 weeks. This past Saturday she was not able to breathe very well [place] to go to a hospital. Diagnosed with bronchitis and given inhaler and cough medicine-still coughing and cannot sleep. Needs work note.    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  She has been sick off/on since then. Also has kids in daycare and kindergarten. They are not currently sick.  She has been sick for about 3 weeks with cough, fatigue. She has slight runny nose, PND. Phlegm is yellow-green, gets up after a steamy shower and throughout the day. Nasal drainage is clear.  Cough has been worse in the last week (was ignoring it, didn't want to miss work or the wedding she was in). She then started having shortness of breath, feels tight in her mid-chest. She had negative COVID test at home (and again in the ER.)  11/2019 she was out of town for a wedding.  She started feeling worse that day, and then wasn't able to catch her breath, was complaining of chest pain.  No UC's were in the area, so went to the ER.  She was diagnosed with bronchitis. She was prescribed albuterol inhaler and tessalon perles. The albuterol helps temporarily.  The perles helps some also (taken it twice). She used Mucinex DM 12 hour up until the ER visit. She is still coughing a lot, productive, some ongoing discolored phlegm.  Patient brought in paperwork (AVS): D-dimer was abnormal, spiral CT PE study was normal.  CXR normal, troponin normal. CBC, chem 7, PT/PTT, u/a and EKG reportedly normal. No results sent, just her AVS with "normal" and "abnormal" results marked for the studies done.  H/o perioral dermatitis with steroid use in the past for poison ivy.   PMH, PSH, SH reviewed  Outpatient Encounter Medications as of 12/10/2021  Medication Sig Note   albuterol (VENTOLIN HFA) 108 (90  Base) MCG/ACT inhaler Inhale into the lungs. 12/10/2021: Used this am   benzonatate (TESSALON) 200 MG capsule Take by mouth. 12/10/2021: Took one at 6am   BLACK ELDERBERRY PO Take 1 each by mouth daily. 12/10/2021: gummy   buPROPion (WELLBUTRIN XL) 300 MG 24 hr tablet TAKE 1 TABLET BY MOUTH EVERY DAY    cholecalciferol (VITAMIN D3) 25 MCG (1000 UNIT) tablet Take 1,000 Units by mouth daily.    drospirenone-ethinyl estradiol (YAZ) 3-0.02 MG tablet Take 1 tablet by mouth daily.    LYSINE ACETATE PO Take 1 tablet by mouth daily.    Multiple Vitamins-Minerals (MULTIVITAMIN WITH MINERALS) tablet Take 1 tablet by mouth daily.    oxybutynin (DITROPAN) 5 MG tablet Take 5 mg by mouth at bedtime. 07/22/2020: Takes qHS   ibuprofen (ADVIL) 200 MG tablet Take 400 mg by mouth every 6 (six) hours as needed. (Patient not taking: Reported on 12/10/2021) 08/26/2021: Took last night   No facility-administered encounter medications on file as of 12/10/2021.   No Known Allergies  ROS: no fever, some chills, myalgias. No nausea.  Has had some vomiting, post-tussive emesis. Some decreased appeitite. Yesterday she had some diarrhea (which she relates to her cycles). PMS/moods are much better with being on Yaz. No bleeding, bruising, rashes. SOB and cough per HPI.   PHYSICAL EXAM:  BP 100/70   Pulse 64   Temp 97.7 F (36.5 C) (Tympanic)   Ht 5' 9.75" (1.772 m)  Wt 170 lb 6.4 oz (77.3 kg)   LMP 11/15/2021 (Approximate)   BMI 24.63 kg/m   Pleasant female, coughing frequently during visit.  Appears to have some intermittent shortness of breath when talking a lot and after coughing. HEENT: conjunctiva and sclera are clear, EOMI.  No purulent nasal drainage. Sinuses are nontender. OP is clear Neck:no lymphadenopathy or mass Heart: regular rate and rhythm Lungs: Good air movement overall. Some diffuse wheeze/ronchi (more on the R than L) Extremities: no edema Psych: normal mood, affect, hygiene and  grooming Neuro: alert and oriented, cranial nerves intact, normal strength, gait.   ASSESSMENT/PLAN:  Moderate persistent asthmatic bronchitis with acute exacerbation - persistent cough, purulent, with RAD/wheezing. Treat with z-pak and short course of steroids. - Plan: azithromycin (ZITHROMAX) 250 MG tablet, predniSONE (DELTASONE) 20 MG tablet  Risks/SE of meds reviewed. To f/u if persistent/worsening symptoms. Work note--OOW for rest of the week.  I spent 30 minutes dedicated to the care of this patient, including pre-visit review of records, face to face time, post-visit ordering of testing and documentation.   Take the zpak as directed. Remember that it works for 10 days, even though you take it for just 5. Be sure to stay well hydrated. Restart the Mucinex-DM 12 hour twice daily. Take the steroids to help with the wheezing and shortness of breath. It is prescribed for 20mg  twice daily for 5 days. If you have significant side effects, you can back down to a taper instead of 40mg  daily (use 30mg , 20mg  and 10mg  1 day each after the initial day or 2 of 40mg ).

## 2021-12-10 NOTE — Patient Instructions (Signed)
Take the zpak as directed. Remember that it works for 10 days, even though you take it for just 5. Be sure to stay well hydrated. Restart the Mucinex-DM 12 hour twice daily. Take the steroids to help with the wheezing and shortness of breath. It is prescribed for 20mg  twice daily for 5 days. If you have significant side effects, you can back down to a taper instead of 40mg  daily (use 30mg , 20mg  and 10mg  1 day each after the initial day or 2 of 40mg ).

## 2021-12-16 ENCOUNTER — Telehealth: Payer: Self-pay | Admitting: Family Medicine

## 2021-12-16 NOTE — Telephone Encounter (Signed)
I'm happy to see her for f/u if she isn't feeling better.

## 2021-12-16 NOTE — Telephone Encounter (Signed)
Patient coming tomorrow at 1:30

## 2021-12-16 NOTE — Telephone Encounter (Signed)
She has seen some improvement but today she states she feels worse "in other areas" States she is having shortness of breath and feels light headed  Please advise  ( I did put hold on 1:30 slot for tomorrow for now, incase you need to see her)

## 2021-12-16 NOTE — Progress Notes (Unsigned)
No chief complaint on file.  Seen 10/25 with illness, treated for asthmatic bronchitis with z-pak and prednisone pack.    Per 10/25 note: She has been sick for about 3 weeks with cough, fatigue. She has slight runny nose, PND. Phlegm is yellow-green, gets up after a steamy shower and throughout the day. Nasal drainage is clear.  Cough has been worse in the last week (was ignoring it, didn't want to miss work or the wedding she was in). She then started having shortness of breath, feels tight in her mid-chest. She had negative COVID test at home (and again in the ER.)   11/2019 she was out of town for a wedding.  She started feeling worse that day, and then wasn't able to catch her breath, was complaining of chest pain.  No UC's were in the area, so went to the ER.  She was diagnosed with bronchitis. She was prescribed albuterol inhaler and tessalon perles. The albuterol helps temporarily.  The perles helps some also (taken it twice). She used Mucinex DM 12 hour up until the ER visit. She is still coughing a lot, productive, some ongoing discolored phlegm.   Patient brought in paperwork (AVS): D-dimer was abnormal, spiral CT PE study was normal.  CXR normal, troponin normal. CBC, chem 7, PT/PTT, u/a and EKG reportedly normal. No results sent, just her AVS with "normal" and "abnormal" results marked for the studies done.   H/o perioral dermatitis with steroid use in the past for poison ivy.   PMH, PSH, SH reviewed   UPDATE: ROS: no fever, some chills, myalgias. No nausea.  Has had some vomiting, post-tussive emesis. Some decreased appeitite. Yesterday she had some diarrhea (which she relates to her cycles). PMS/moods are much better with being on Yaz. No bleeding, bruising, rashes. SOB and cough per HPI.   PHYSICAL EXAM:  LMP 11/15/2021 (Approximate)    Pleasant female, coughing frequently during visit.  Appears to have some intermittent shortness of breath when talking a lot  and after coughing. HEENT: conjunctiva and sclera are clear, EOMI.  No purulent nasal drainage. Sinuses are nontender. OP is clear Neck:no lymphadenopathy or mass Heart: regular rate and rhythm Lungs: Good air movement overall. Some diffuse wheeze/ronchi (more on the R than L) Extremities: no edema Psych: normal mood, affect, hygiene and grooming Neuro: alert and oriented, cranial nerves intact, normal strength, gait.  ***UPDATE EXAM   ASSESSMENT/PLAN:

## 2021-12-17 ENCOUNTER — Ambulatory Visit
Admission: RE | Admit: 2021-12-17 | Discharge: 2021-12-17 | Disposition: A | Discharge status: Home / Self Care | Payer: BC Managed Care – PPO | Source: Ambulatory Visit | Attending: Family Medicine | Admitting: Family Medicine

## 2021-12-17 ENCOUNTER — Ambulatory Visit: Payer: BC Managed Care – PPO | Admitting: Family Medicine

## 2021-12-17 ENCOUNTER — Encounter: Payer: Self-pay | Admitting: Family Medicine

## 2021-12-17 VITALS — BP 108/70 | HR 76 | Temp 97.0°F | Ht 69.75 in | Wt 168.8 lb

## 2021-12-17 DIAGNOSIS — R052 Subacute cough: Secondary | ICD-10-CM

## 2021-12-17 DIAGNOSIS — R062 Wheezing: Secondary | ICD-10-CM | POA: Diagnosis not present

## 2021-12-17 DIAGNOSIS — R0781 Pleurodynia: Secondary | ICD-10-CM | POA: Diagnosis not present

## 2021-12-17 MED ORDER — PREDNISONE 10 MG (21) PO TBPK: ORAL_TABLET | ORAL | 0 refills | Status: DC

## 2021-12-17 NOTE — Progress Notes (Signed)
done

## 2021-12-17 NOTE — Patient Instructions (Signed)
Go to [address] [facility]) for a chest x-ray. Continue the Mucinex DM I would also take Sudafed (behind the counter type, where you show your license). Continue to use albuterol as needed.   If the x-ray shows pneumonia, we will send in a different antibiotic. If it is clear, we will give another course of steroid and have you add the sudafed, and see if that helps.  You may use the benzonatate along with the other medications, if needed.

## 2021-12-30 ENCOUNTER — Other Ambulatory Visit: Payer: Self-pay | Admitting: Family Medicine

## 2021-12-30 NOTE — Telephone Encounter (Signed)
Left message asking if patient needs this refilled.

## 2022-02-03 ENCOUNTER — Telehealth: Payer: Self-pay | Admitting: Family Medicine

## 2022-02-03 DIAGNOSIS — F3281 Premenstrual dysphoric disorder: Secondary | ICD-10-CM

## 2022-02-03 MED ORDER — BUPROPION HCL ER (XL) 300 MG PO TB24: ORAL_TABLET | ORAL | 1 refills | Status: DC

## 2022-02-03 NOTE — Telephone Encounter (Signed)
Crystal Wilkerson was written for 6 months in July, shouldn't be due for refill until January. We haven't really had f/u to discuss how this is working for her and her moods. (She was only seen for a sick visit after). Please ensure moods are doing better (related to her cycles), and if not, she should schedule a med check. If doing well, can refill until her CPE in June (unsure if she may have 1 RF left at pharmacy).   I sent in RF for wellbutrin

## 2022-02-03 NOTE — Telephone Encounter (Signed)
Pt left message she needs refill on Wellbutrin & Yaz to [facility]

## 2022-02-04 MED ORDER — DROSPIRENONE-ETHINYL ESTRADIOL 3-0.02 MG PO TABS: 1.0000 | ORAL_TABLET | Freq: Every day | ORAL | 6 refills | Status: DC

## 2022-02-04 NOTE — Telephone Encounter (Signed)
Spoke with patient and she is doing significantly better she said, There was no refill at either pharmacy listed in her chart as I called both. [facility] to [facility] to last her until June 27th CPE

## 2022-02-18 ENCOUNTER — Encounter: Payer: Self-pay | Admitting: Family Medicine

## 2022-02-18 ENCOUNTER — Ambulatory Visit: Payer: BC Managed Care – PPO | Admitting: Family Medicine

## 2022-02-18 VITALS — BP 120/72 | HR 68 | Temp 98.4°F | Ht 69.75 in | Wt 174.8 lb

## 2022-02-18 DIAGNOSIS — L292 Pruritus vulvae: Secondary | ICD-10-CM

## 2022-02-18 DIAGNOSIS — R350 Frequency of micturition: Secondary | ICD-10-CM

## 2022-02-18 LAB — POCT URINALYSIS DIP (PROADVANTAGE DEVICE)
Bilirubin, UA: NEGATIVE
Blood, UA: NEGATIVE
Glucose, UA: NEGATIVE mg/dL
Ketones, POC UA: NEGATIVE mg/dL
Leukocytes, UA: NEGATIVE
Nitrite, UA: NEGATIVE
Protein Ur, POC: NEGATIVE mg/dL
Specific Gravity, Urine: 1.01
Urobilinogen, Ur: 0.2
pH, UA: 6 (ref 5.0–8.0)

## 2022-02-18 MED ORDER — HYDROCORTISONE 2.5 % EX CREA: TOPICAL_CREAM | Freq: Two times a day (BID) | CUTANEOUS | 0 refills | Status: DC

## 2022-02-18 NOTE — Patient Instructions (Signed)
  Use a once-daily antihistamine every day (such as claritin, allegra or zyrtec). You may continue to take the benadryl at bedtime, if needed for itching or sleep. Use the prescription hydrocortisone cream twice daily to the affected, itchy area. Do not use this for longer than 2 weeks. Continue to limit your exposure to scents, detergents, etc.  We are referring you to a gynecologist to help [place], if these measures don't resolve your problem.

## 2022-02-18 NOTE — Progress Notes (Signed)
Chief Complaint  Patient presents with   Vaginal Itching    External vaginal itching that has been unbearable the last month. Has white patchy skin as well that is where the itching is, has used hydrocortisone cream and it does not help. Has been having urinary urgency and frequency.    She has had ongoing issues with external vaginal itching.  It has been itchy and dry for many months, just worse and more bothersome in the last month. The itching is worse at night, needs to take benadryl to sleep. She switched to this after trying hydrocortisone cream at bedtime for about a week; benadryl seems to help more. She was scratching in her sleep, but is waking up less often and scratching less since taking benadryl. She reports there is a white patch on her left labia, which is the itchiest. Intercourse is painful.  She has slight discharge related to her cycle only--no odor, no yellow or creamy discharge. Finished cycle 5 days ago. Itching isn't any worse during cycle.  She stopped using the period underwear she used to use (only wears it when she is at work when on her cycle). She wears cotton underwear, uses OB tampons (same since high school). Switched toilet paper (non-chemically treated--not dyed, a little rougher).   PMH, PSH, SH reviewed  Outpatient Encounter Medications as of 02/18/2022  Medication Sig Note   BLACK ELDERBERRY PO Take 1 each by mouth daily. 12/10/2021: gummy   buPROPion (WELLBUTRIN XL) 300 MG 24 hr tablet TAKE 1 TABLET BY MOUTH EVERY DAY    cholecalciferol (VITAMIN D3) 25 MCG (1000 UNIT) tablet Take 1,000 Units by mouth daily.    diphenhydrAMINE (BENADRYL) 25 MG tablet Take 25 mg by mouth at bedtime as needed for itching or sleep. 02/18/2022: Using nightly for the last week   drospirenone-ethinyl estradiol (YAZ) 3-0.02 MG tablet Take 1 tablet by mouth daily.    LYSINE ACETATE PO Take 1 tablet by mouth daily.    Multiple Vitamins-Minerals (MULTIVITAMIN WITH MINERALS) tablet  Take 1 tablet by mouth daily.    oxybutynin (DITROPAN) 5 MG tablet Take 5 mg by mouth at bedtime. 07/22/2020: Takes qHS   albuterol (VENTOLIN HFA) 108 (90 Base) MCG/ACT inhaler Inhale into the lungs. (Patient not taking: Reported on 02/18/2022) 02/18/2022: prn   ibuprofen (ADVIL) 200 MG tablet Take 400 mg by mouth every 6 (six) hours as needed. (Patient not taking: Reported on 02/18/2022) 02/18/2022: prn   [DISCONTINUED] benzonatate (TESSALON) 200 MG capsule Take by mouth. (Patient not taking: Reported on 12/17/2021)    [DISCONTINUED] dextromethorphan-guaiFENesin (MUCINEX DM) 30-600 MG 12hr tablet Take 1 tablet by mouth 2 (two) times daily. 12/17/2021: Took at 8am   [DISCONTINUED] predniSONE (STERAPRED UNI-PAK 21 TAB) 10 MG (21) TBPK tablet Take as directed    No facility-administered encounter medications on file as of 02/18/2022.   No Known Allergies  ROS:  no fever, chills, URI symptoms or cough. No n/v/d. No abdominal pain. No urinary complaints (wanted urine checked to ensure no bladder issues--some frequency, but no dysuria, incontinence). Leta Baptist is helping with moods.   Still feels just a little down with her cycles, but much improved. No abnormal vaginal discharge.   PHYSICAL EXAM:  BP 120/72   Pulse 68   Temp 98.4 F (36.9 C) (Tympanic)   Ht 5' 9.75" (1.772 m)   Wt 174 lb 12.8 oz (79.3 kg)   LMP 02/10/2022 (Exact Date)   BMI 25.26 kg/m   Wt Readings from Last 3  Encounters:  02/18/22 174 lb 12.8 oz (79.3 kg)  12/17/21 168 lb 12.8 oz (76.6 kg)  12/10/21 170 lb 6.4 oz (77.3 kg)   Pleasant, well-appearing female in no distress HEENT: conjunctiva and sclera are clear, EOMI. Neck: no lymphadenopathy or mass Heart: regular rate and rhythm, no murmur Lungs: clear bilaterally Abdomen: soft, nontender, no mass External genitalia:  no soft tissue swelling or lesions.  Her area of concern, bilaterally on the labia, there is noted to be a slightly pale area (no patch/plaque, just paler), with  some mild erythema/pinkness just medial to this, on both sides.  Within the pale area, bilaterally, there are some blue/black dots, symmetric on both sides.  These spots are similar to previously (gradually improving/lessening, per pt). She reports that both the pale and pink areas are both very itchy. There is no actual rash, or excoriation, or ulcers. There is some clearish-white discharge present in the vaginal vault. Extremities: no edema Psych: normal mood, affect, hygiene and grooming  Normal urine dip.   ASSESSMENT/PLAN:  Vulvar itching - reassured re: exam--no ulcers, lesions. Just slight paleness and pinkness. No e/o yeast or rash. Trial HC 2.5% and antihistamines. To GYN if not better - Plan: hydrocortisone 2.5 % cream, Ambulatory referral to Obstetrics / Gynecology  Urinary frequency - Plan: POCT Urinalysis DIP (Proadvantage Device)  Use a once-daily antihistamine every day (such as claritin, allegra or zyrtec). You may continue to take the benadryl at bedtime, if needed for itching or sleep. Use the prescription hydrocortisone cream twice daily to the affected, itchy area. Do not use this for longer than 2 weeks. Continue to limit your exposure to scents, detergents, etc.  We are referring you to a gynecologist to help [place], if these measures don't resolve your problem.

## 2022-03-14 NOTE — Telephone Encounter (Signed)
done

## 2022-04-20 ENCOUNTER — Encounter: Payer: Self-pay | Admitting: Family Medicine

## 2022-04-20 ENCOUNTER — Telehealth: Payer: BC Managed Care – PPO | Admitting: Family Medicine

## 2022-04-20 VITALS — Temp 98.0°F | Ht 69.75 in | Wt 170.0 lb

## 2022-04-20 DIAGNOSIS — J069 Acute upper respiratory infection, unspecified: Secondary | ICD-10-CM | POA: Diagnosis not present

## 2022-04-20 NOTE — Progress Notes (Signed)
Start time: 12:10 End time: 12:26  Virtual Visit via Video Note  I connected with Crystal Wilkerson on 04/20/22 by a video enabled telemedicine application and verified that I am speaking with the correct person using two identifiers.  Location: Patient: home Provider: office   I discussed the limitations of evaluation and management by telemedicine and the availability of in person appointments. The patient expressed understanding and agreed to proceed.  History of Present Illness:  Chief Complaint  Patient presents with   Sore Throat    VIRTUAL sore throat and lots of mucus. Both of her kids are home with pink eye, she does not have eye symptoms yet (she would like to know if she can have rx just in case). Symptoms started on Saturday. Head and neck are sore, no fever. Had covid two weeks ago.    2 days ago she woke up with sore throat, hard to sleep due to pain.  Sore throat persists. Now she feels like she has a cold--has lost her voice today/hoarseness. She has a lot of runny nose, postnasal drainage, and has some soreness around her jawline bilaterally. Nasal drainage is green. Only slight cough. No shortness of breath or wheezing.  Kids both have pinkeye.  Son has a double ear infection, and daughter was coughing a lot last night.  They have doctor appointments later today.  Denies any fever. She had COVID 2 weeks ago, still feels tired from this.  Hasn't taken any OTC meds yet, just Advil, which helps some for her sore throat.   PMH, PSH, SH reviewed  Outpatient Encounter Medications as of 04/20/2022  Medication Sig Note   BLACK ELDERBERRY PO Take 1 each by mouth daily. 12/10/2021: gummy   buPROPion (WELLBUTRIN XL) 300 MG 24 hr tablet TAKE 1 TABLET BY MOUTH EVERY DAY    cholecalciferol (VITAMIN D3) 25 MCG (1000 UNIT) tablet Take 1,000 Units by mouth daily.    drospirenone-ethinyl estradiol (YAZ) 3-0.02 MG tablet Take 1 tablet by mouth daily.    ibuprofen (ADVIL) 200 MG  tablet Take 400 mg by mouth every 6 (six) hours as needed. 04/20/2022: Took 2 this am   LYSINE ACETATE PO Take 1 tablet by mouth daily.    Multiple Vitamins-Minerals (MULTIVITAMIN WITH MINERALS) tablet Take 1 tablet by mouth daily.    oxybutynin (DITROPAN) 5 MG tablet Take 5 mg by mouth at bedtime. 07/22/2020: Takes qHS   albuterol (VENTOLIN HFA) 108 (90 Base) MCG/ACT inhaler Inhale into the lungs. (Patient not taking: Reported on 02/18/2022) 04/20/2022: prn   diphenhydrAMINE (BENADRYL) 25 MG tablet Take 25 mg by mouth at bedtime as needed for itching or sleep. (Patient not taking: Reported on 04/20/2022) 04/20/2022: As needed   hydrocortisone 2.5 % cream Apply topically 2 (two) times daily. Use for up to 2 weeks, as needed for itching (Patient not taking: Reported on 04/20/2022) 04/20/2022: As needed   No facility-administered encounter medications on file as of 04/20/2022.   No Known Allergies  No fever, sinus pain, shortness of breath (some fatigue with activity since COVID) or chest pain. +sore throat and URI symptoms per HPI. No eye drainage, redness or crusting.    Observations/Objective:  Temp 98 F (36.7 C) (Temporal)   Ht 5' 9.75" (1.772 m)   Wt 170 lb (77.1 kg)   LMP 04/09/2022 (Exact Date)   BMI 24.57 kg/m   Pleasant female. Hoarse voice, sounds congested. Rare cough. Speaking easily and comfortably. She is alert and oriented, cranial nerves are grossly  intact Exam is limited due to the virtual nature of the visit.  Assessment and Plan:  Viral upper respiratory illness - supportive measures reviewed. +household pink-eye, but pt with no sx. Discussed viral nature, and no need for ABX unless worsening  Drink plenty of water. Continue to use advil and/or tylenol, if needed for pain or fever. Use Mucinex (plain or DM version--get the DM if you are coughing a lot). You can use a decongestant such as sudafed (pseudoephedrine) if needed for any nasal congestion, sinus pain, runny  nose. Use lubricating eye drops as needed for any discomfort. You are likely to develop a pinkeye (given the same virus and exposure to your kids with pinkeye). This is viral and likely will resolve on its own. If it is getting worse, or is limited to just once eye (and not quickly spreading), then we can consider using an antibiotic drop.    Follow Up Instructions:    I discussed the assessment and treatment plan with the patient. The patient was provided an opportunity to ask questions and all were answered. The patient agreed with the plan and demonstrated an understanding of the instructions.   The patient was advised to call back or seek an in-person evaluation if the symptoms worsen or if the condition fails to improve as anticipated.  I spent 20 minutes dedicated to the care of this patient, including pre-visit review of records, face to face time, post-visit ordering of testing and documentation.    Vikki Ports, MD

## 2022-04-20 NOTE — Patient Instructions (Signed)
Drink plenty of water. Continue to use advil and/or tylenol, if needed for pain or fever. Use Mucinex (plain or DM version--get the DM if you are coughing a lot). You can use a decongestant such as sudafed (pseudoephedrine) if needed for any nasal congestion, sinus pain, runny nose. Use lubricating eye drops as needed for any discomfort. You are likely to develop a pinkeye (given the same virus and exposure to your kids with pinkeye). This is viral and likely will resolve on its own. If it is getting worse, or is limited to just once eye (and not quickly spreading), then we can consider using an antibiotic drop.

## 2022-05-01 ENCOUNTER — Other Ambulatory Visit: Payer: Self-pay | Admitting: Family Medicine

## 2022-05-01 DIAGNOSIS — Z1231 Encounter for screening mammogram for malignant neoplasm of breast: Secondary | ICD-10-CM

## 2022-05-06 ENCOUNTER — Ambulatory Visit
Admission: RE | Admit: 2022-05-06 | Discharge: 2022-05-06 | Disposition: A | Discharge status: Home / Self Care | Payer: BC Managed Care – PPO | Source: Ambulatory Visit | Attending: Family Medicine | Admitting: Family Medicine

## 2022-05-06 DIAGNOSIS — Z1231 Encounter for screening mammogram for malignant neoplasm of breast: Secondary | ICD-10-CM

## 2022-07-21 ENCOUNTER — Other Ambulatory Visit: Payer: Self-pay | Admitting: Family Medicine

## 2022-07-21 DIAGNOSIS — F3281 Premenstrual dysphoric disorder: Secondary | ICD-10-CM

## 2022-08-01 ENCOUNTER — Other Ambulatory Visit: Payer: Self-pay | Admitting: Family Medicine

## 2022-08-10 ENCOUNTER — Telehealth: Payer: Self-pay | Admitting: Family Medicine

## 2022-08-10 MED ORDER — BUPROPION HCL ER (XL) 300 MG PO TB24: ORAL_TABLET | ORAL | 0 refills | Status: DC

## 2022-08-10 NOTE — Telephone Encounter (Signed)
Done

## 2022-08-10 NOTE — Telephone Encounter (Signed)
Pt does need refill on wellbutrin, moved appt to 7/8

## 2022-08-13 ENCOUNTER — Encounter: Payer: BC Managed Care – PPO | Admitting: Family Medicine

## 2022-08-22 ENCOUNTER — Encounter: Payer: Self-pay | Admitting: Family Medicine

## 2022-08-22 NOTE — Progress Notes (Unsigned)
No chief complaint on file.  Patient is a 42 year old female who presents for annual physical exam.   PMDD--diagnosed last year. She has a h/o depression and postpartum depression. She continues on Wellbutrin, and Yaz was started last year to help with PMDD. She reported starting with back discomfort, feeling gloomy, not wanting to get out of bed. Symptoms improved during her cycle. She had reported "It's like I'm 2 different people" related to how she feels around her cycle and after it is over.   She continues to do well on Yaz and Wellbutrin. She denies side effects.   She has suspected MS (clinically isolated syndrome, demyelinating disease noted on MRI, but no progression; if she has future events, would lead to dx of relapsing remitting MS per neuro). She last had virtual visit with neuro 06/2020, with MRI done 08/2020, see below. MRI 08/2020: CONCLUSION:  1.  Likely no significant change in supratentorial white matter disease when accounting for differences in imaging technique, presumably representing sequelae of prior demyelinating events. No new white matter lesions identified. No contrast material was administered for today's exam.  2.  No acute intracranial abnormality.   Diagnosis came related to testing for urinary issues. She was started on ditropan 5mg  qHS by neuro to help with nocturia. This has helped a lot--now gets up 1-2x/night, down from 6-7x.  Neuro last refilled this 08/2021.  FOLLOW-UP???  She had stopped wearing the period underwear (see visits she had for irritation).  The irritation she was having has improved, but still has some.  Only has urinary leakage when running around with the kids, possibly with intercourse, or with a hard cough or laugh.   Immunization History  Administered Date(s) Administered   Influenza Split 01/12/2011, 02/16/2014, 11/07/2014, 11/01/2015   Influenza,inj,Quad PF,6+ Mos 12/03/2017, 10/04/2018   Influenza,inj,quad, With Preservative  10/18/2014   Influenza-Unspecified 11/04/2018, 11/17/2019, 11/23/2020, 11/17/2021   MMR 10/26/2008   PFIZER(Purple Top)SARS-COV-2 Vaccination 04/22/2019, 05/13/2019, 01/01/2020   PPD Test 09/25/2021   Pfizer Covid-19 Vaccine Bivalent Booster 16yrs & up 11/23/2020   Tdap 02/16/2013, 01/01/2014, 10/04/2015, 05/25/2018   Last pap: 11/2018 Last mammo: 04/2022 Last DEXA: never Last colonoscopy: 2019 through [facility] (normal; IBS) Dentist: twice a year Ophtho: yearly, wears contacts/glasses Exercise:  3x/week rowing machine, 20 minutes.  Walks daily ("leisurely stroll") with dogs, kids.   Lipid screen:  Lab Results  Component Value Date   CHOL 209 (H) 07/22/2020   HDL 83 07/22/2020   LDLCALC 115 (H) 07/22/2020   TRIG 62 07/22/2020   CHOLHDL 2.5 07/22/2020   Vitamin D-OH 35.5 in 07/2020.  Normal c-met, CBC, TSH 07/2020  PMH, PSH, SH and FH were reviewed and chart updated    ROS:  The patient denies anorexia, fever, headaches,  vision changes, decreased hearing, ear pain, sore throat, breast concerns, chest pain, palpitations, dizziness, syncope, dyspnea on exertion, cough, swelling, nausea, vomiting, constipation, abdominal pain, melena, hematochezia, indigestion/heartburn, hematuria,  dysuria, irregular menstrual cycles, vaginal discharge, odor or itch, genital lesions, joint pains, numbness, tingling, weakness, tremor, suspicious skin lesions, anxiety, abnormal bleeding/bruising, or enlarged lymph nodes.  IBS is stable, imodium prn is helpful for the diarrhea/cramping.  Cutting down on coffee has helped (IB Guard has helped in past, too expensive) Nocturia, improved with ditropan.  Some leakage during the day, see HPI. Moods Energy Sleep    PHYSICAL EXAM:  There were no vitals taken for this visit.  Wt Readings from Last 3 Encounters:  04/20/22 170 lb (77.1 kg)  02/18/22 174 lb 12.8 oz (79.3 kg)  12/17/21 168 lb 12.8 oz (76.6 kg)  Wt 168# 12/2021   General Appearance:     Alert, cooperative, no distress, appears stated age  Head:    Normocephalic, without obvious abnormality, atraumatic  Eyes:    PERRL, conjunctiva/corneas clear, EOM's intact, fundi    benign  Ears:    Normal TM's and external ear canals  Nose:   Normal no drainage, sinuses nontender  Throat:   Normal, no lesions  Neck:   Supple, no lymphadenopathy;  thyroid:  no enlargement/ tenderness/nodules; no carotid bruit or JVD  Back:    Spine nontender, no curvature, ROM normal, no CVA     tenderness  Lungs:     Clear to auscultation bilaterally without wheezes, rales or     ronchi; respirations unlabored  Chest Wall:    No tenderness or deformity   Heart:    Regular rate and rhythm, S1 and S2 normal, no murmur, rub   or gallop  Breast Exam:    No nipple discharge or inversion.  No breast masses, skin dimpling, tenderness or axillary lymphadenopathy.  Abdomen:     Soft, non-tender, nondistended, normoactive bowel sounds,    no masses, no hepatosplenomegaly  Genitalia:    Normal external genitalia without lesions. The punctate purple/blue papules on external genitalia/labia majora are much smaller than at last visit, not inflamed. BUS and vagina normal. No cervical motion tenderness, adnexal or uterine tenderness or masses  Rectal:   Normal sphincter tone, no masses. Heme negative stool  Extremities:   No clubbing, cyanosis or edema  Pulses:   2+ and symmetric all extremities  Skin:   Skin color, texture, turgor normal, no rashes.   Lymph nodes:   Cervical, supraclavicular, axillary and inguinal nodes normal  Neurologic:   Normal strength, sensation and gait; reflexes 1+ and symmetric throughout          Psych:   Normal mood, affect, hygiene and grooming.    ***UPDATE GU--blue/purple papules on labia???***     08/06/2021    1:43 PM 07/22/2020    9:49 AM  Depression screen PHQ 2/9  Decreased Interest 0 0  Down, Depressed, Hopeless 1 0  PHQ - 2 Score 1 0  Altered sleeping 1   Tired, decreased  energy 2   Change in appetite 3   Feeling bad or failure about yourself  3   Trouble concentrating 3   Moving slowly or fidgety/restless 0   Suicidal thoughts 0   PHQ-9 Score 13   Difficult doing work/chores Very difficult     ASSESSMENT/PLAN:  Full PHQ-9 Any COVID booster in 2023? Any f/u with neuro??  ?any labs?  Discussed monthly self breast exams and yearly mammograms; at least 30 minutes of aerobic activity at least 5 days/week, weight-bearing exercise at least 2x/week, core strengthening exercises recommended; proper sunscreen use reviewed; healthy diet, including goals of calcium and vitamin D intake and alcohol recommendations (less than or equal to 1 drink/day) reviewed; regular seatbelt use; changing batteries in smoke detectors.  Immunization recommendations discussed--continue yearly flu shots.  Updated bivalent COVID booster recommended when available (Fall). Colonoscopy recommendations reviewed, age 81. Pap smear next year.  F/u 1 year, sooner prn

## 2022-08-22 NOTE — Patient Instructions (Incomplete)
  HEALTH MAINTENANCE RECOMMENDATIONS:  It is recommended that you get at least 30 minutes of aerobic exercise at least 5 days/week (for weight loss, you may need as much as 60-90 minutes). This can be any activity that gets your heart rate up. This can be divided in 10-15 minute intervals if needed, but try and build up your endurance at least once a week.  Weight bearing exercise is also recommended twice weekly.  Eat a healthy diet with lots of vegetables, fruits and fiber.  "Colorful" foods have a lot of vitamins (ie green vegetables, tomatoes, red peppers, etc).  Limit sweet tea, regular sodas and alcoholic beverages, all of which has a lot of calories and sugar.  Up to 1 alcoholic drink daily may be beneficial for women (unless trying to lose weight, watch sugars).  Drink a lot of water.  Calcium recommendations are 1200-1500 mg daily (1500 mg for postmenopausal women or women without ovaries), and vitamin D 1000 IU daily.  This should be obtained from diet and/or supplements (vitamins), and calcium should not be taken all at once, but in divided doses.  Monthly self breast exams and yearly mammograms for women over the age of 84 is recommended.  Sunscreen of at least SPF 30 should be used on all sun-exposed parts of the skin when outside between the hours of 10 am and 4 pm (not just when at beach or pool, but even with exercise, golf, tennis, and yard work!)  Use a sunscreen that says "broad spectrum" so it covers both UVA and UVB rays, and make sure to reapply every 1-2 hours.  Remember to change the batteries in your smoke detectors when changing your clock times in the spring and fall. Carbon monoxide detectors are recommended for your home.  Use your seat belt every time you are in a car, and please drive safely and not be distracted with cell phones and texting while driving.  Continue yearly flu shots. I recommend getting the new COVID booster when available in the Fall. Pap smear is  due next year. Return sooner for a pelvic exam (when not on your cycle) if you have any concerns or issues.  Please try and get some more intensive cardio--this might help with moods and sleep (or just "me-time").  I encourage you to resume counseling. Consider starting low dose ADHD medications, as this may help reduce anxiety and help with focus at the new job. You wouldn't necessarily need to take this longterm (and just on work-days).  For now, we decided to go ahead with lexapro (escitalopram) to treat depression and anxiety.  We will use trazodone just as needed at bedtime for sleep. You may find you don't need this as often once the lexapro is effective.  Start the lexapro at 1/2 tablet.  Take this for 1-2 weeks, then increase to the full pill (when you can tolerate it). If you can't tolerate it, stay at the 1/2 tablet. Switch to morning dosing if it worsens your insomnia.  If you can't even tolerate 1/2 tablet, then let us know and perhaps we can revisit the idea of the ADHD medication sooner.  Start Trazodone first--take 1/2 tablet, and if not effective you can increase to the full pill. Take this for a few days before starting the lexapro, so that you won't be starting 2 meds at the same time, and not be sure of which med is causing which side effects.

## 2022-08-24 ENCOUNTER — Ambulatory Visit (INDEPENDENT_AMBULATORY_CARE_PROVIDER_SITE_OTHER): Payer: No Typology Code available for payment source | Admitting: Family Medicine

## 2022-08-24 ENCOUNTER — Encounter: Payer: Self-pay | Admitting: Family Medicine

## 2022-08-24 VITALS — BP 112/68 | HR 64 | Ht 69.5 in | Wt 175.2 lb

## 2022-08-24 DIAGNOSIS — G4700 Insomnia, unspecified: Secondary | ICD-10-CM

## 2022-08-24 DIAGNOSIS — R5383 Other fatigue: Secondary | ICD-10-CM

## 2022-08-24 DIAGNOSIS — R42 Dizziness and giddiness: Secondary | ICD-10-CM

## 2022-08-24 DIAGNOSIS — F32A Depression, unspecified: Secondary | ICD-10-CM

## 2022-08-24 DIAGNOSIS — F3281 Premenstrual dysphoric disorder: Secondary | ICD-10-CM | POA: Diagnosis not present

## 2022-08-24 DIAGNOSIS — F909 Attention-deficit hyperactivity disorder, unspecified type: Secondary | ICD-10-CM

## 2022-08-24 DIAGNOSIS — Z0000 Encounter for general adult medical examination without abnormal findings: Secondary | ICD-10-CM

## 2022-08-24 DIAGNOSIS — F419 Anxiety disorder, unspecified: Secondary | ICD-10-CM

## 2022-08-24 LAB — CMP14+EGFR

## 2022-08-24 LAB — CBC WITH DIFFERENTIAL/PLATELET

## 2022-08-24 LAB — VITAMIN D 25 HYDROXY (VIT D DEFICIENCY, FRACTURES)

## 2022-08-24 LAB — TSH

## 2022-08-24 MED ORDER — ESCITALOPRAM OXALATE 10 MG PO TABS
ORAL_TABLET | ORAL | 1 refills | Status: DC
Start: 2022-08-24 — End: 2022-10-29

## 2022-08-24 MED ORDER — TRAZODONE HCL 50 MG PO TABS
25.0000 mg | ORAL_TABLET | Freq: Every evening | ORAL | 1 refills | Status: DC | PRN
Start: 2022-08-24 — End: 2022-10-20

## 2022-08-25 LAB — CMP14+EGFR
Chloride: 101 mmol/L (ref 96–106)
Sodium: 139 mmol/L (ref 134–144)

## 2022-08-25 LAB — CBC WITH DIFFERENTIAL/PLATELET
Basophils Absolute: 0 10*3/uL (ref 0.0–0.2)
Basos: 1 %
EOS (ABSOLUTE): 0.2 10*3/uL (ref 0.0–0.4)
Eos: 3 %
Hematocrit: 35.9 % (ref 34.0–46.6)
Hemoglobin: 11.9 g/dL (ref 11.1–15.9)
MCH: 29.8 pg (ref 26.6–33.0)
MCHC: 33.1 g/dL (ref 31.5–35.7)
Monocytes Absolute: 0.5 10*3/uL (ref 0.1–0.9)
Monocytes: 8 %
Neutrophils Absolute: 3 10*3/uL (ref 1.4–7.0)
Neutrophils: 47 %
WBC: 6.4 10*3/uL (ref 3.4–10.8)

## 2022-08-25 LAB — FERRITIN

## 2022-08-27 LAB — CBC WITH DIFFERENTIAL/PLATELET
Lymphocytes Absolute: 2.7 10*3/uL (ref 0.7–3.1)
RBC: 3.99 x10E6/uL (ref 3.77–5.28)
RDW: 12.1 % (ref 11.7–15.4)

## 2022-08-27 LAB — CMP14+EGFR
ALT: 23 IU/L (ref 0–32)
Glucose: 83 mg/dL (ref 70–99)

## 2022-09-21 ENCOUNTER — Encounter: Payer: Self-pay | Admitting: *Deleted

## 2022-09-22 NOTE — Progress Notes (Unsigned)
No chief complaint on file.  Patient presents for follow-up on moods (depression and anxiety), insomnia.  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 She was worrying about disappointing others, imposter syndrome. She was on wellbutrin for depression, and Yaz was started last year for PMDD. She was having trouble sleeping. She has h/o ADHD, and we discussed that might be contributing to some of her anxiety with the new job. She was hesitant to start ADHD meds, so we started her on lexapro. She was also started on trazodone to help with sleep. She was encouraged to resume seeing a therapist.  PHQ-9 score was 23 and GAD-7 score was 20, prior to starting the lexapro.  Today she reports   PMH, PSH, SH reviewed   ROS:    PHYSICAL EXAM:  LMP 08/21/2022        08/24/2022    8:40 AM  GAD 7 : Generalized Anxiety Score  Nervous, Anxious, on Edge 3  Control/stop worrying 3  Worry too much - different things 3  Trouble relaxing 3  Restless 3  Easily annoyed or irritable 3  Afraid - awful might happen 2  Total GAD 7 Score 20  Anxiety Difficulty Very difficult       08/24/2022    8:38 AM 08/06/2021    1:43 PM 07/22/2020    9:49 AM  Depression screen PHQ 2/9  Decreased Interest 3 0 0  Down, Depressed, Hopeless 2 1 0  PHQ - 2 Score 5 1 0  Altered sleeping 3 1   Tired, decreased energy 3 2   Change in appetite 3 3   Feeling bad or failure about yourself  3 3   Trouble concentrating 3 3   Moving slowly or fidgety/restless 3 0   Suicidal thoughts 0 0   PHQ-9 Score 23 13   Difficult doing work/chores Very difficult Very difficult      ASSESSMENT/PLAN:   Phq-9 and GAD-7

## 2022-09-23 ENCOUNTER — Ambulatory Visit: Payer: No Typology Code available for payment source | Admitting: Family Medicine

## 2022-09-23 ENCOUNTER — Encounter: Payer: Self-pay | Admitting: Family Medicine

## 2022-09-23 VITALS — BP 100/70 | HR 64 | Ht 69.5 in | Wt 176.8 lb

## 2022-09-23 DIAGNOSIS — G4700 Insomnia, unspecified: Secondary | ICD-10-CM

## 2022-09-23 DIAGNOSIS — F419 Anxiety disorder, unspecified: Secondary | ICD-10-CM

## 2022-09-23 DIAGNOSIS — F909 Attention-deficit hyperactivity disorder, unspecified type: Secondary | ICD-10-CM | POA: Diagnosis not present

## 2022-09-23 DIAGNOSIS — F32A Depression, unspecified: Secondary | ICD-10-CM | POA: Diagnosis not present

## 2022-09-23 NOTE — Assessment & Plan Note (Signed)
She has significant ADD, but has been able to manage well.  She is doing much better overall, and prefers to hold off on any treatment for ADHD

## 2022-09-23 NOTE — Assessment & Plan Note (Signed)
Moods are significantly better since getting better sleep, and taking 5mg  of lexapro.  Still has some anxiety.  Tolerating without significant side effects. Recommend increasing to 10mg  of lexapro.  If sleep improves as anxiety improves, she may not need to use trazodone nightly. Counseling encouraged (she is looking into finding a therapist through her insurance). If doing well on the 10mg , she can contact us for a 90d RF (rather than 30), and can fill until her med check in Jan/Feb.

## 2022-09-23 NOTE — Patient Instructions (Signed)
Increase your lexapro to the full tablet. This should help with your anxiety a little more--and perhaps even make you not need a daily medicine for sleep (if you can relax your mind better from treating the anxiety better). After a 2-4 weeks, you can try skipping the Friday and Saturday night trazodone and see how you feel (I'm suggesting the weekends because that is when you are likely less stressed). Ultimately if your insomnia is better, you can cut back using trazodone entirely, and just use it when needed (ie fi you haven't slept well for a couple of nights, or if you have a lot going on).  If you are doing very well on the full tablet, let us know and we can change your prescription to a 90 day supply of the lexapro. If you have any ongoing concerns regarding your moods or sleep, you can schedule a follow-up visit in 1-2 months.

## 2022-09-23 NOTE — Assessment & Plan Note (Signed)
Sleep improved significantly since last visit, taking 25mg  of trazodone nightly, as well as being started on lexapro (only taking 5mg ). Recommended she increase the lexapro to 10mg . If anxiety further improves, her sleep may improve (given changes in sleep hygiene as well)--she may try NOT taking the trazodone every night. To start skipping on weekends to see how she sleeps, and then can switch to prn, if not truly needing daily.

## 2022-10-18 ENCOUNTER — Other Ambulatory Visit: Payer: Self-pay | Admitting: Family Medicine

## 2022-10-18 DIAGNOSIS — G4700 Insomnia, unspecified: Secondary | ICD-10-CM

## 2022-10-20 NOTE — Telephone Encounter (Signed)
Patient is asking for #30 more w/no refill and then she will re-visit if another is needed when Dr. Lynelle Doctor returns.

## 2022-10-29 ENCOUNTER — Other Ambulatory Visit: Payer: Self-pay | Admitting: Family Medicine

## 2022-10-29 DIAGNOSIS — F32A Depression, unspecified: Secondary | ICD-10-CM

## 2022-10-29 NOTE — Telephone Encounter (Signed)
Patient is doing well at the 10mg  dose and would like a 90 day rx.

## 2022-11-12 ENCOUNTER — Other Ambulatory Visit: Payer: Self-pay | Admitting: Family Medicine

## 2022-11-28 ENCOUNTER — Other Ambulatory Visit: Payer: Self-pay | Admitting: Family Medicine

## 2022-11-28 DIAGNOSIS — G4700 Insomnia, unspecified: Secondary | ICD-10-CM

## 2022-11-30 NOTE — Telephone Encounter (Signed)
Left message asking patient if she requested this medication.

## 2022-11-30 NOTE — Telephone Encounter (Signed)
Per last visit in August, she was doing well lon 1/2 tablet. This was last filled by Dr. Susann Givens on 9/3, so I'm guessing this was an auto-refill (because if she is still taking 1/2 tablet/night, would last for 2 months).  It is to be used as needed--if she can sleep well without it, she doesn't need to take it every night.   If she has been using it nightly, she can try seeing if she truly needs it daily (many people, once the lexapro gets on board and anxiety is better, sleep better without the need for additional sleep meds; some people need both).  Since she should have 1/2 bottle left, she can try prn, and if/when she needs refill, she can let us know.

## 2022-12-01 NOTE — Telephone Encounter (Signed)
Patient does not need this medication.

## 2022-12-25 ENCOUNTER — Other Ambulatory Visit: Payer: Self-pay | Admitting: Medical Genetics

## 2022-12-25 DIAGNOSIS — Z006 Encounter for examination for normal comparison and control in clinical research program: Secondary | ICD-10-CM

## 2023-01-29 ENCOUNTER — Other Ambulatory Visit (HOSPITAL_COMMUNITY): Payer: Self-pay | Attending: Medical Genetics

## 2023-02-25 ENCOUNTER — Telehealth: Payer: Self-pay | Admitting: *Deleted

## 2023-02-25 NOTE — Telephone Encounter (Signed)
 I'd really like to do a 6 month f/u on her. It should be a very quick visit if she is doing well. It can be a virtual visit, if that is easier with her job. (She has enough meds until March, but I'd really like to have a 6 month f/u--if doing well, at her physical we can RF meds for a year).

## 2023-02-25 NOTE — Telephone Encounter (Signed)
 Message left asking patient to please call and schedule appt, can be virtual.

## 2023-02-25 NOTE — Telephone Encounter (Signed)
 Patient sent a message through MyChart cancelling for Monday. Crystal Wilkerson spoke with her and let her know that she should reschedule. Patient wanted you to know that her medications are working great! She is scheduled for CPE in July.

## 2023-03-01 ENCOUNTER — Encounter: Payer: No Typology Code available for payment source | Admitting: Family Medicine

## 2023-04-23 ENCOUNTER — Encounter: Payer: Self-pay | Admitting: Internal Medicine

## 2023-04-23 ENCOUNTER — Other Ambulatory Visit: Payer: Self-pay | Admitting: Family Medicine

## 2023-04-23 DIAGNOSIS — F32A Depression, unspecified: Secondary | ICD-10-CM

## 2023-04-30 ENCOUNTER — Encounter: Payer: Self-pay | Admitting: *Deleted

## 2023-05-01 NOTE — Progress Notes (Unsigned)
 No chief complaint on file.  Patient presents for 6 month follow-up on anxiety and depression. She had to cancel her Jan visit due to work.  Worsening anxiety was initially discussed at her physical in July 2024. 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 She was already taking Wellbutrin for depression. She has ADHD, but was hesitant to start ADHD medication. We started her on lexapro and trazodone. At her f/u visit in August, she had been sleeping much better, taking 1/2 tablet of trazodone nightly.  She forgot to increase lexapro to the full pill, was still taking 1/2 (5mg ). At that time she noted significant improvement in her anxiety, at least in part due to sleeping better (from the trazodone). Keeping the phone out of her bedroom also helped with stress and sleep (not obsessing over the news, will read a book instead). She reported being able to get back to sleep if woken by kids, dogs.  She still had some anxiety, so we recommended increasing the dose of lexapro to 10 mg. She had sent a message the following month stating she was doing well on the 10 mg dose of lexapro.  Today she reports ***  ADHD--can't focus or truly "hear" things when distractions are present. She can close her door at work, if needed. She finds it is manageable at work, in a good routine.      PMH, PSH, SH reviewed   ROS: no f/c, URI symptoms headaches, dizziness, chest pain, shortness of breath. No n/v/d/constipation. No bleeding, bruising, rash  Moods per HPI Insomnia is well controlled/improved  Libido? Other sexual SE?   PHYSICAL EXAM:  There were no vitals taken for this visit.  Wt Readings from Last 3 Encounters:  09/23/22 176 lb 12.8 oz (80.2 kg)  08/24/22 175 lb 3.2 oz (79.5 kg)  04/20/22 170 lb (77.1 kg)         09/23/2022   11:44 AM 08/24/2022    8:40 AM  GAD 7 : Generalized Anxiety Score  Nervous, Anxious, on Edge 3 3  Control/stop  worrying 1 3  Worry too much - different things 1 3  Trouble relaxing 0 3  Restless 0 3  Easily annoyed or irritable 0 3  Afraid - awful might happen 0 2  Total GAD 7 Score 5 20  Anxiety Difficulty Not difficult at all Very difficult       09/23/2022   11:42 AM 08/24/2022    8:38 AM 08/06/2021    1:43 PM 07/22/2020    9:49 AM  Depression screen PHQ 2/9  Decreased Interest 0 3 0 0  Down, Depressed, Hopeless 0 2 1 0  PHQ - 2 Score 0 5 1 0  Altered sleeping 1 3 1    Tired, decreased energy 1 3 2    Change in appetite 0 3 3   Feeling bad or failure about yourself  3 3 3    Trouble concentrating 3 3 3    Moving slowly or fidgety/restless 1 3 0   Suicidal thoughts 0 0 0   PHQ-9 Score 9 23 13    Difficult doing work/chores Not difficult at all Very difficult Very difficult       ASSESSMENT/PLAN:  Phq-9 Gad-7  Does she need trazodone RF? Unsure if she still uses it at all.

## 2023-05-03 ENCOUNTER — Ambulatory Visit: Admitting: Family Medicine

## 2023-05-03 ENCOUNTER — Encounter: Payer: Self-pay | Admitting: Family Medicine

## 2023-05-03 VITALS — BP 118/70 | HR 68 | Ht 69.5 in | Wt 195.0 lb

## 2023-05-03 DIAGNOSIS — Z6828 Body mass index (BMI) 28.0-28.9, adult: Secondary | ICD-10-CM | POA: Diagnosis not present

## 2023-05-03 DIAGNOSIS — R635 Abnormal weight gain: Secondary | ICD-10-CM

## 2023-05-03 DIAGNOSIS — G4700 Insomnia, unspecified: Secondary | ICD-10-CM

## 2023-05-03 DIAGNOSIS — F909 Attention-deficit hyperactivity disorder, unspecified type: Secondary | ICD-10-CM | POA: Diagnosis not present

## 2023-05-03 DIAGNOSIS — F419 Anxiety disorder, unspecified: Secondary | ICD-10-CM

## 2023-05-03 DIAGNOSIS — R9230 Dense breasts, unspecified: Secondary | ICD-10-CM

## 2023-05-03 DIAGNOSIS — F32A Depression, unspecified: Secondary | ICD-10-CM

## 2023-05-03 MED ORDER — BUPROPION HCL ER (XL) 300 MG PO TB24
ORAL_TABLET | ORAL | 1 refills | Status: DC
Start: 2023-05-03 — End: 2023-10-25

## 2023-05-03 MED ORDER — TRAZODONE HCL 50 MG PO TABS: 50.0000 mg | ORAL_TABLET | Freq: Every evening | ORAL | 0 refills | Status: AC | PRN

## 2023-05-03 MED ORDER — ESCITALOPRAM OXALATE 10 MG PO TABS
10.0000 mg | ORAL_TABLET | Freq: Every day | ORAL | 1 refills | Status: DC
Start: 2023-05-03 — End: 2023-10-25

## 2023-05-03 NOTE — Patient Instructions (Signed)
 Try tracking your food again, as this will likely help with weight. Continue to try and stay active (lunch walks, and more exercise as a family). If weight continues to go up despite your efforts, we can discuss changing medication (as sometimes the lexapro can cause weight gain, usually not).

## 2023-05-21 ENCOUNTER — Other Ambulatory Visit: Payer: Self-pay | Admitting: Family Medicine

## 2023-05-21 DIAGNOSIS — Z1231 Encounter for screening mammogram for malignant neoplasm of breast: Secondary | ICD-10-CM

## 2023-06-06 ENCOUNTER — Other Ambulatory Visit: Payer: Self-pay | Admitting: Family Medicine

## 2023-06-06 DIAGNOSIS — G4700 Insomnia, unspecified: Secondary | ICD-10-CM

## 2023-06-07 NOTE — Telephone Encounter (Signed)
 Left message asking patient if she needs or if this was auto refill?

## 2023-06-11 ENCOUNTER — Ambulatory Visit: Admitting: Nurse Practitioner

## 2023-06-11 ENCOUNTER — Encounter: Payer: Self-pay | Admitting: Nurse Practitioner

## 2023-06-11 ENCOUNTER — Ambulatory Visit
Admission: RE | Admit: 2023-06-11 | Discharge: 2023-06-11 | Disposition: A | Discharge status: Home / Self Care | Payer: Self-pay | Source: Ambulatory Visit | Attending: Family Medicine | Admitting: Family Medicine

## 2023-06-11 VITALS — BP 122/80 | HR 65 | Wt 189.8 lb

## 2023-06-11 DIAGNOSIS — Z1231 Encounter for screening mammogram for malignant neoplasm of breast: Secondary | ICD-10-CM

## 2023-06-11 DIAGNOSIS — N3090 Cystitis, unspecified without hematuria: Secondary | ICD-10-CM | POA: Insufficient documentation

## 2023-06-11 DIAGNOSIS — R300 Dysuria: Secondary | ICD-10-CM

## 2023-06-11 LAB — POCT URINALYSIS DIP (CLINITEK)
Bilirubin, UA: NEGATIVE
Blood, UA: NEGATIVE
Glucose, UA: NEGATIVE mg/dL
Ketones, POC UA: NEGATIVE mg/dL
Leukocytes, UA: NEGATIVE
Nitrite, UA: NEGATIVE
POC PROTEIN,UA: NEGATIVE
Spec Grav, UA: <=1.005 — AB (ref 1.010–1.025)
Urobilinogen, UA: 0.2 U/dL
pH, UA: 6 (ref 5.0–8.0)

## 2023-06-11 MED ORDER — PHENAZOPYRIDINE HCL 200 MG PO TABS: 200.0000 mg | ORAL_TABLET | Freq: Three times a day (TID) | ORAL | 0 refills | Status: DC | PRN

## 2023-06-11 MED ORDER — FLUCONAZOLE 150 MG PO TABS
ORAL_TABLET | ORAL | 2 refills | Status: DC
Start: 2023-06-11 — End: 2023-06-23

## 2023-06-11 MED ORDER — SULFAMETHOXAZOLE-TRIMETHOPRIM 800-160 MG PO TABS: 1.0000 | ORAL_TABLET | Freq: Two times a day (BID) | ORAL | 0 refills | Status: DC

## 2023-06-11 NOTE — Progress Notes (Signed)
 Crystal Kief, DNP, AGNP-c [facility] [address] [place], [place] [address] [phone]   ACUTE VISIT- ESTABLISHED PATIENT  Blood pressure 122/80, pulse 65, weight 189 lb 12.8 oz (86.1 kg), last menstrual period 05/30/2023.  Subjective:  HPI Crystal Wilkerson is a 43 y.o. female presents to day for evaluation of acute concern(s).   Urinary symptoms with bladder spasms started about 2 weeks ago. Since then she has been experiencing urinary urgency and leakage, suprapubic pain, a "sensation of burning that outlines the bladder", low back pain, and right sided flank pain (started in the last day). She has not had any new sexual partners. She has not had a fever, chills, nausea, or vomiting. No vaginal symptoms.  She reports holding her urine more frequently recently due to demands at work.  In the last several days she has been increasing her water intake significantly.   ROS negative except for what is listed in HPI. History, Medications, Surgery, SDOH, and Family History reviewed and updated as appropriate.  Objective:  Physical Exam Vitals and nursing note reviewed.  Constitutional:      General: She is not in acute distress.    Appearance: Normal appearance.  Eyes:     Conjunctiva/sclera: Conjunctivae normal.  Cardiovascular:     Rate and Rhythm: Normal rate and regular rhythm.     Pulses: Normal pulses.     Heart sounds: Normal heart sounds.  Pulmonary:     Effort: Pulmonary effort is normal.     Breath sounds: Normal breath sounds.  Abdominal:     General: Bowel sounds are normal. There is no distension.     Palpations: Abdomen is soft.     Tenderness: There is abdominal tenderness. There is right CVA tenderness and guarding. There is no left CVA tenderness or rebound.  Musculoskeletal:     Right lower leg: No edema.     Left lower leg: No edema.  Skin:    General: Skin is warm and dry.     Capillary Refill: Capillary refill takes less than 2  seconds.  Neurological:     General: No focal deficit present.     Mental Status: She is alert and oriented to person, place, and time.     Motor: No weakness.  Psychiatric:        Mood and Affect: Mood normal.         Assessment & Plan:   Problem List Items Addressed This Visit     Dysuria - Primary   Progressive symptoms of dysuria, suprapubic pain/pressure, low back pain, urinary urgency, and right flank pain for the last 2 weeks. Today she does show suprapubic tenderness and right flank tenderness on exam with other findings normal. I am reassured that she has no fever, nausea, or vomiting at this time. Her UA in the office was normal, however, she has had a significant increase in water consumption therefore this could have caused some dilution. Based on her symptoms I will send treatment with bactrim  and fluconazole  and send her urine for culture. If needed, I will make changes to the plan of care based on the culture results. We discussed warning symptoms such as fever, chills, nausea, and/or vomiting that would warrant her to seek care in the urgent care over the weekend. Pyridium  sent to help with symptom management while the antibiotic begins to work. Recommend f/u if symptoms do not improve.       Relevant Medications   fluconazole  (DIFLUCAN ) 150 MG tablet  phenazopyridine (PYRIDIUM) 200 MG tablet   sulfamethoxazole-trimethoprim (BACTRIM DS) 800-160 MG tablet      Crystal Kief, DNP, AGNP-c

## 2023-06-11 NOTE — Patient Instructions (Signed)

## 2023-06-11 NOTE — Assessment & Plan Note (Signed)
 Progressive symptoms of dysuria, suprapubic pain/pressure, low back pain, urinary urgency, and right flank pain for the last 2 weeks. Today she does show suprapubic tenderness and right flank tenderness on exam with other findings normal. I am reassured that she has no fever, nausea, or vomiting at this time. Her UA in the office was normal, however, she has had a significant increase in water consumption therefore this could have caused some dilution. Based on her symptoms I will send treatment with bactrim and fluconazole  and send her urine for culture. If needed, I will make changes to the plan of care based on the culture results. We discussed warning symptoms such as fever, chills, nausea, and/or vomiting that would warrant her to seek care in the urgent care over the weekend. Pyridium sent to help with symptom management while the antibiotic begins to work. Recommend f/u if symptoms do not improve.

## 2023-06-13 LAB — URINE CULTURE

## 2023-06-14 ENCOUNTER — Other Ambulatory Visit: Payer: Self-pay

## 2023-06-14 ENCOUNTER — Encounter: Payer: Self-pay | Admitting: Nurse Practitioner

## 2023-06-14 DIAGNOSIS — R300 Dysuria: Secondary | ICD-10-CM

## 2023-06-14 DIAGNOSIS — B9689 Other specified bacterial agents as the cause of diseases classified elsewhere: Secondary | ICD-10-CM

## 2023-06-14 MED ORDER — AMOXICILLIN 500 MG PO CAPS: 500.0000 mg | ORAL_CAPSULE | Freq: Three times a day (TID) | ORAL | 0 refills | Status: AC

## 2023-06-16 ENCOUNTER — Other Ambulatory Visit: Payer: Self-pay | Admitting: Family Medicine

## 2023-06-16 ENCOUNTER — Ambulatory Visit: Admitting: Family Medicine

## 2023-06-16 DIAGNOSIS — R928 Other abnormal and inconclusive findings on diagnostic imaging of breast: Secondary | ICD-10-CM

## 2023-06-18 ENCOUNTER — Other Ambulatory Visit (INDEPENDENT_AMBULATORY_CARE_PROVIDER_SITE_OTHER): Admitting: Nurse Practitioner

## 2023-06-18 ENCOUNTER — Ambulatory Visit (INDEPENDENT_AMBULATORY_CARE_PROVIDER_SITE_OTHER): Admitting: Nurse Practitioner

## 2023-06-18 DIAGNOSIS — R319 Hematuria, unspecified: Secondary | ICD-10-CM

## 2023-06-18 DIAGNOSIS — R300 Dysuria: Secondary | ICD-10-CM

## 2023-06-18 DIAGNOSIS — N3090 Cystitis, unspecified without hematuria: Secondary | ICD-10-CM

## 2023-06-18 DIAGNOSIS — N390 Urinary tract infection, site not specified: Secondary | ICD-10-CM

## 2023-06-18 LAB — POCT URINALYSIS DIP (CLINITEK)
Bilirubin, UA: NEGATIVE
Blood, UA: NEGATIVE
Glucose, UA: NEGATIVE mg/dL
Ketones, POC UA: NEGATIVE mg/dL
Leukocytes, UA: NEGATIVE
Nitrite, UA: NEGATIVE
POC PROTEIN,UA: NEGATIVE
Spec Grav, UA: 1.01 (ref 1.010–1.025)
Urobilinogen, UA: 0.2 U/dL
pH, UA: 6 (ref 5.0–8.0)

## 2023-06-18 MED ORDER — CEFTRIAXONE SODIUM 500 MG IJ SOLR
500.0000 mg | Freq: Once | INTRAMUSCULAR | Status: AC
  Administered 2023-06-18: 500 mg via INTRAMUSCULAR

## 2023-06-18 NOTE — Progress Notes (Unsigned)
  Annella Kief, DNP, AGNP-c [facility] [address] [place], [place] [address] [phone]   ACUTE VISIT- ESTABLISHED PATIENT  Last menstrual period 05/30/2023.  Subjective:  HPI Crystal Wilkerson is a 43 y.o. female presents to day for repeat evaluation of UTI.     ROS negative except for what is listed in HPI. History, Medications, Surgery, SDOH, and Family History reviewed and updated as appropriate.  Objective:  Physical Exam      Assessment & Plan:   Problem List Items Addressed This Visit   None     Annella Kief, DNP, AGNP-c

## 2023-06-21 ENCOUNTER — Ambulatory Visit: Admitting: Nurse Practitioner

## 2023-06-22 ENCOUNTER — Other Ambulatory Visit

## 2023-06-22 NOTE — Progress Notes (Signed)
  Annella Kief, DNP, AGNP-c [facility] [address] [place], [place] [address] [phone]   ACUTE VISIT- ESTABLISHED PATIENT  Last menstrual period 05/30/2023.  Subjective:  HPI Crystal Wilkerson is a 43 y.o. female presents to day for evaluation of acute concern(s).   [name] continues to experience dysuria and generalized feeling of unwell despite oral antibiotics for StrepB UTI. No fevers. No odor with urination. Urgency continues.   ROS negative except for what is listed in HPI. History, Medications, Surgery, SDOH, and Family History reviewed and updated as appropriate.  Objective:  Physical Exam Vitals and nursing note reviewed.  Constitutional:      General: She is not in acute distress.    Appearance: Normal appearance. She is not ill-appearing.  Eyes:     Conjunctiva/sclera: Conjunctivae normal.  Cardiovascular:     Rate and Rhythm: Normal rate and regular rhythm.     Pulses: Normal pulses.     Heart sounds: Normal heart sounds.  Pulmonary:     Effort: Pulmonary effort is normal.     Breath sounds: Normal breath sounds.  Abdominal:     General: Bowel sounds are normal. There is no distension.     Palpations: Abdomen is soft.     Tenderness: There is no abdominal tenderness. There is right CVA tenderness. There is no left CVA tenderness or guarding.  Musculoskeletal:     Cervical back: Neck supple.     Right lower leg: No edema.     Left lower leg: No edema.  Skin:    General: Skin is warm and dry.     Capillary Refill: Capillary refill takes less than 2 seconds.  Neurological:     Mental Status: She is alert and oriented to person, place, and time.  Psychiatric:        Behavior: Behavior normal.         Assessment & Plan:   Problem List Items Addressed This Visit     Cystitis   Ongoing concern with cystisis. Treated initially with macrobid , but changed to amoxicillin  once strep was found on culture. UA in the office today reads normal,  but this also read normal with original testing. Microscopic evaluation shows bacteria, clue cells, and budding yeast. Clumps of red blood cells are also noted. Given the findings and ongoing symptoms, recommend Rocephin injection to aid in management. Consider treatment for BV if symptoms continue, although the number of clue cells is not extensive. She has fluconazole  for yeast management and will take this once antibiotics are completed. Follow-up on Monday with report of how she is feeling.       Other Visit Diagnoses       Urinary tract infection with hematuria, site unspecified    -  Primary   Relevant Medications   cefTRIAXone (ROCEPHIN) injection 500 mg (Completed)   Other Relevant Orders   POCT URINALYSIS DIP (CLINITEK) (Completed)     Burning with urination       Relevant Medications   cefTRIAXone (ROCEPHIN) injection 500 mg (Completed)   Other Relevant Orders   POCT URINALYSIS DIP (CLINITEK) (Completed)         Annella Kief, DNP, AGNP-c

## 2023-06-22 NOTE — Progress Notes (Signed)
  Crystal Kief, DNP, AGNP-c [facility] [address] [place], [place] [address] [phone]   ACUTE VISIT- ESTABLISHED PATIENT  Last menstrual period 05/30/2023.  Subjective:  HPI Crystal Wilkerson is a 43 y.o. female presents to day for evaluation of acute concern(s).   [name] continues to experience dysuria and generalized feeling of unwell despite oral antibiotics for StrepB UTI. No fevers. No odor with urination. Urgency continues.   ROS negative except for what is listed in HPI. History, Medications, Surgery, SDOH, and Family History reviewed and updated as appropriate.  Objective:  Physical Exam Vitals and nursing note reviewed.  Constitutional:      General: She is not in acute distress.    Appearance: Normal appearance. She is not ill-appearing.  Eyes:     Conjunctiva/sclera: Conjunctivae normal.  Cardiovascular:     Rate and Rhythm: Normal rate and regular rhythm.     Pulses: Normal pulses.     Heart sounds: Normal heart sounds.  Pulmonary:     Effort: Pulmonary effort is normal.     Breath sounds: Normal breath sounds.  Abdominal:     General: Bowel sounds are normal. There is no distension.     Palpations: Abdomen is soft.     Tenderness: There is no abdominal tenderness. There is right CVA tenderness. There is no left CVA tenderness or guarding.  Musculoskeletal:     Cervical back: Neck supple.     Right lower leg: No edema.     Left lower leg: No edema.  Skin:    General: Skin is warm and dry.     Capillary Refill: Capillary refill takes less than 2 seconds.  Neurological:     Mental Status: She is alert and oriented to person, place, and time.  Psychiatric:        Behavior: Behavior normal.         Assessment & Plan:   Problem List Items Addressed This Visit     Cystitis   Ongoing concern with cystisis. Treated initially with macrobid , but changed to amoxicillin  once strep was found on culture. UA in the office today reads normal,  but this also read normal with original testing. Microscopic evaluation shows bacteria, clue cells, and budding yeast. Clumps of red blood cells are also noted. Given the findings and ongoing symptoms, recommend Rocephin injection to aid in management. Consider treatment for BV if symptoms continue, although the number of clue cells is not extensive. She has fluconazole  for yeast management and will take this once antibiotics are completed. Follow-up on Monday with report of how she is feeling.       Other Visit Diagnoses       Urinary tract infection with hematuria, site unspecified    -  Primary   Relevant Medications   cefTRIAXone (ROCEPHIN) injection 500 mg (Completed)   Other Relevant Orders   POCT URINALYSIS DIP (CLINITEK) (Completed)     Burning with urination       Relevant Medications   cefTRIAXone (ROCEPHIN) injection 500 mg (Completed)   Other Relevant Orders   POCT URINALYSIS DIP (CLINITEK) (Completed)         Crystal Kief, DNP, AGNP-c

## 2023-06-22 NOTE — Assessment & Plan Note (Signed)
 Ongoing concern with cystisis. Treated initially with macrobid , but changed to amoxicillin  once strep was found on culture. UA in the office today reads normal, but this also read normal with original testing. Microscopic evaluation shows bacteria, clue cells, and budding yeast. Clumps of red blood cells are also noted. Given the findings and ongoing symptoms, recommend Rocephin injection to aid in management. Consider treatment for BV if symptoms continue, although the number of clue cells is not extensive. She has fluconazole  for yeast management and will take this once antibiotics are completed. Follow-up on Monday with report of how she is feeling.

## 2023-06-22 NOTE — Addendum Note (Signed)
 Addended by: Shooter Tangen, Abraham Hoffmann E on: 06/22/2023 06:51 PM   Modules accepted: Level of Service

## 2023-06-23 MED ORDER — FLUCONAZOLE 150 MG PO TABS: ORAL_TABLET | ORAL | 2 refills | Status: DC

## 2023-06-23 MED ORDER — METRONIDAZOLE 500 MG PO TABS
500.0000 mg | ORAL_TABLET | Freq: Two times a day (BID) | ORAL | 0 refills | Status: DC
Start: 2023-06-23 — End: 2023-08-29

## 2023-06-23 NOTE — Addendum Note (Signed)
 Addended by: Aldrich Lloyd, Abraham Hoffmann E on: 06/23/2023 01:14 PM   Modules accepted: Orders

## 2023-06-28 ENCOUNTER — Other Ambulatory Visit

## 2023-06-28 ENCOUNTER — Encounter: Payer: Self-pay | Admitting: Family Medicine

## 2023-06-28 DIAGNOSIS — Z006 Encounter for examination for normal comparison and control in clinical research program: Secondary | ICD-10-CM

## 2023-06-29 ENCOUNTER — Ambulatory Visit
Admission: RE | Admit: 2023-06-29 | Discharge: 2023-06-29 | Disposition: A | Discharge status: Home / Self Care | Source: Ambulatory Visit | Attending: Family Medicine | Admitting: Family Medicine

## 2023-06-29 DIAGNOSIS — R928 Other abnormal and inconclusive findings on diagnostic imaging of breast: Secondary | ICD-10-CM

## 2023-06-29 NOTE — Progress Notes (Unsigned)
 No chief complaint on file.  Patient sent message on 5/12: "Hi Dr. Monnie Anthony! I saw Abraham Hoffmann a couple of times for a UTI since I came on a Friday when you are not in office. She was great, but I wanted to connect with you about it for follow up since I'm still not feeling better. I do feel much better than when I started showing symptoms, but it still burns before and after I go to the bathroom, sex is painful, and I am having urine frequency and incontinence. The right side of my back is also tender. I am just not sure what else to do. I am leaving for vacation Thursday and was hoping to feel better by then."   06/18/23--dysuria and generalized feeling of unwell despite oral antibiotics for StrepB UTI. No fevers. No odor with urination. Urgency continues.  Treated initially with macrobid , but changed to amoxicillin  once strep was found on culture. UA in the office today reads normal, but this also read normal with original testing. Microscopic evaluation shows bacteria, clue cells, and budding yeast. Clumps of red blood cells are also noted. Given the findings and ongoing symptoms, recommend Rocephin  injection to aid in management. Consider treatment for BV if symptoms continue, although the number of clue cells is not extensive. She has fluconazole  for yeast management and will take this once antibiotics are completed.   06/11/23 visit--bladder spasms started about 2 weeks ago. Since then she has been experiencing urinary urgency and leakage, suprapubic pain, a "sensation of burning that outlines the bladder", low back pain, and right sided flank pain (started in the last day).   Had been holding urine more (d/t demands at work). 06/11/23--Urine culture 25-50K GBS     PMH, PSH, SH reviewed   ROS:    PHYSICAL EXAM:  LMP 05/30/2023   Wt Readings from Last 3 Encounters:  06/11/23 189 lb 12.8 oz (86.1 kg)  05/03/23 195 lb (88.5 kg)  09/23/22 176 lb 12.8 oz (80.2 kg)        ASSESSMENT/PLAN:   U/a If normal, rec pelvic exam

## 2023-06-30 ENCOUNTER — Other Ambulatory Visit: Payer: Self-pay | Admitting: Family Medicine

## 2023-06-30 ENCOUNTER — Encounter: Payer: Self-pay | Admitting: Family Medicine

## 2023-06-30 ENCOUNTER — Ambulatory Visit (INDEPENDENT_AMBULATORY_CARE_PROVIDER_SITE_OTHER): Admitting: Family Medicine

## 2023-06-30 VITALS — BP 118/64 | HR 80 | Temp 98.6°F | Ht 69.5 in | Wt 189.0 lb

## 2023-06-30 DIAGNOSIS — N941 Unspecified dyspareunia: Secondary | ICD-10-CM

## 2023-06-30 DIAGNOSIS — R350 Frequency of micturition: Secondary | ICD-10-CM

## 2023-06-30 DIAGNOSIS — R300 Dysuria: Secondary | ICD-10-CM

## 2023-06-30 DIAGNOSIS — G379 Demyelinating disease of central nervous system, unspecified: Secondary | ICD-10-CM | POA: Diagnosis not present

## 2023-06-30 DIAGNOSIS — N6489 Other specified disorders of breast: Secondary | ICD-10-CM

## 2023-06-30 LAB — POCT URINALYSIS DIP (PROADVANTAGE DEVICE)
Bilirubin, UA: NEGATIVE
Glucose, UA: NEGATIVE mg/dL
Ketones, POC UA: NEGATIVE mg/dL
Leukocytes, UA: NEGATIVE
Nitrite, UA: NEGATIVE
Protein Ur, POC: NEGATIVE mg/dL
Specific Gravity, Urine: 1.005
Urobilinogen, Ur: 0.2
pH, UA: 6 (ref 5.0–8.0)

## 2023-06-30 NOTE — Patient Instructions (Addendum)
 Continue to stay well hydrated. Given the yeast that Crystal Wilkerson saw, and additional antibiotics, take the fluconazole .  The yeast can contribute to itching, irritation and discomfort.  You only take 1 pill, and it can take a week to see the full effect.  If you notice thick white discharge or persistent itching, you can then use an over-the-counter monistat (a week later).  We are sending the urine for culture to look for any ongoing infection. Since symptoms are improving and urine shows only blood (and you're on your cycle), let's wait for the culture to determine if any treatment is necessary. The differential diagnosis includes a non-infectious cause, such as interstitial cystitis. There is also the possibility that this is mimicking the symptoms for which you first saw the neurologist.  Let's see what the MRI shows. Next steps would be a urology referral if you continue to having ongoing urinary complaints, without any evidence of infection. See the attached information regarding foods to avoid, in case IC is what is causing your symptoms.

## 2023-07-02 ENCOUNTER — Ambulatory Visit: Payer: Self-pay | Admitting: Internal Medicine

## 2023-07-02 ENCOUNTER — Other Ambulatory Visit: Payer: Self-pay | Admitting: Medical

## 2023-07-02 LAB — URINE CULTURE

## 2023-07-02 MED ORDER — AMOXICILLIN 875 MG PO TABS: 875.0000 mg | ORAL_TABLET | Freq: Two times a day (BID) | ORAL | 0 refills | Status: DC

## 2023-07-05 LAB — GENECONNECT MOLECULAR SCREEN: Genetic Analysis Overall Interpretation: NEGATIVE

## 2023-07-13 ENCOUNTER — Encounter: Payer: Self-pay | Admitting: Family Medicine

## 2023-07-13 DIAGNOSIS — N3289 Other specified disorders of bladder: Secondary | ICD-10-CM

## 2023-07-13 DIAGNOSIS — R350 Frequency of micturition: Secondary | ICD-10-CM

## 2023-07-13 DIAGNOSIS — R300 Dysuria: Secondary | ICD-10-CM

## 2023-07-13 MED ORDER — HYOSCYAMINE SULFATE 0.125 MG PO TBDP
0.1250 mg | ORAL_TABLET | ORAL | 0 refills | Status: AC | PRN
Start: 2023-07-13 — End: ?

## 2023-08-23 ENCOUNTER — Encounter: Payer: Self-pay | Admitting: Family Medicine

## 2023-08-29 NOTE — Progress Notes (Unsigned)
 No chief complaint on file.  Patient is a 43 year old female who presents for annual physical exam.  L ankle sprain 07/01/23   Bladder spasms, dysuria--last seen for this in May.  Urine culture only showed 10-25K GBS, for which she had been prescribed amoxil . She was prescribed hyoscyamine  later that month for persistent spasms, and was later referred to [facility].  Previously took oxybutynin  ***UPDATE  Anxiety and Depression:  At her physical last year (2024) she had been taking wellbutrin  for depression (along with Yaz for PMDD), but noted more anxiety and insomnia.  She was started on lexapro  and trazodone , and at f/u visit was doing much better.  She denies sexual side effects, or any other side effects. She hasn't been having issues with ADHD--she is abe to close her door at work, if needed, to concentrate.  Weight gain had been noted, but she also had a full time job that was sedentary.  She is walking on lunch breaks, eats fast food rarely.  She reported she was going to start tracking food again (previously had done well with [employer]), to hold her accountable.  Wt Readings from Last 3 Encounters:  06/30/23 189 lb (85.7 kg)  06/11/23 189 lb 12.8 oz (86.1 kg)  05/03/23 195 lb (88.5 kg)  05/03/23 195 lb (88.5 kg) 09/23/22 176 lb 12.8 oz (80.2 kg)  08/24/22 175 lb 3.2 oz (79.5 kg)  04/20/22 170 lb (77.1 kg)     She has suspected MS (clinically isolated syndrome, demyelinating disease noted on MRI, but no progression; if she has future events, would lead to dx of relapsing remitting MS per neuro). She last saw neuro 09/2022.  Plan was to repeat MRI in 06/2023.  *** She has yearly f/u scheduled with neuro in 09/2023.   Immunization History  Administered Date(s) Administered   Fluzone Influenza virus vaccine,trivalent (IIV3), split virus 01/12/2011, 11/07/2014, 11/01/2015   Influenza Split 02/16/2014   Influenza,inj,Quad PF,6+ Mos 12/03/2017, 10/04/2018    Influenza,inj,quad, With Preservative 10/18/2014   Influenza-Unspecified 11/04/2018, 11/17/2019, 11/23/2020, 11/17/2021, 11/17/2022   MMR 10/26/2008   PFIZER(Purple Top)SARS-COV-2 Vaccination 04/22/2019, 05/13/2019, 01/01/2020   PPD Test 09/25/2021   Pfizer Covid-19 Vaccine Bivalent Booster 24yrs & up 11/23/2020   Pfizer(Comirnaty)Fall Seasonal Vaccine 12 years and older 11/17/2022   Tdap 02/16/2013, 01/01/2014, 10/04/2015, 05/25/2018   Last pap: 11/2018 Last mammo: 05/2023 (required add'l imaging of L d/t asymmetry); 6 mo f/u L breast imaging rec. Last DEXA: never Last colonoscopy: 2019 through [facility] (normal; IBS) Dentist: twice a year Ophtho: yearly, wears contacts/glasses Exercise:  Walks daily (leisurely stroll) with dogs, kids. Carries 59 year old frequently. No other weight-bearing exercise.  Lipid screen:  Lab Results  Component Value Date   CHOL 209 (H) 07/22/2020   HDL 83 07/22/2020   LDLCALC 115 (H) 07/22/2020   TRIG 62 07/22/2020   CHOLHDL 2.5 07/22/2020  Denies changes to her diet.  Vitamin D -OH 35.5 in 07/2020.  Normal c-met, CBC, TSH 07/2020   PMH, PSH, SH and FH were reviewed and chart updated    ROS:  The patient denies anorexia, fever, headaches,  vision changes, decreased hearing, sore throat, breast concerns, chest pain, palpitations, syncope, dyspnea on exertion, cough, swelling, nausea, vomiting, constipation, abdominal pain, melena, hematochezia, indigestion/heartburn, hematuria,  dysuria, irregular menstrual cycles, vaginal discharge, odor or itch, genital lesions, joint pains, numbness, tingling, weakness, tremor, suspicious skin lesions, abnormal bleeding, or enlarged lymph nodes. Saw dermatologist recently.  IBS is stable, imodium prn is  helpful for the diarrhea/cramping (needs much less often).  Using IB Gard and switching from coffee to Angel Fire tea helped a lot in controlling her IBS. Nocturia, 4-5x/night.  Some leakage during the day, see HPI.  ***UPDATE  Denies snoring Some decreased libido is present (unchanged, was present prior to taking SSRI) Easy bruising, unchanged.  L ankle   PHYSICAL EXAM:  There were no vitals taken for this visit.  Wt Readings from Last 3 Encounters:  06/30/23 189 lb (85.7 kg)  06/11/23 189 lb 12.8 oz (86.1 kg)  05/03/23 195 lb (88.5 kg)    General Appearance:    Alert, cooperative, no distress, appears stated age  Head:    Normocephalic, without obvious abnormality, atraumatic  Eyes:    PERRL, conjunctiva/corneas clear, EOM's intact, fundi    benign  Ears:    Normal TM's and external ear canals.   Nose:   Normal no drainage, sinuses nontender  Throat:   Normal, no lesions  Neck:   Supple, no lymphadenopathy;  thyroid :  no enlargement/ tenderness/nodules; no carotid bruit or JVD  Back:    Spine nontender, no curvature, ROM normal, no CVA     tenderness  Lungs:     Clear to auscultation bilaterally without wheezes, rales or     ronchi; respirations unlabored  Chest Wall:    No tenderness or deformity   Heart:    Regular rate and rhythm, S1 and S2 normal, no murmur, rub   or gallop  Breast Exam:    No nipple discharge or inversion.  No breast masses, skin dimpling, tenderness or axillary lymphadenopathy.  Abdomen:     Soft, non-tender, nondistended, normoactive bowel sounds,    no masses, no hepatosplenomegaly  Genitalia:    Normal external genitalia, without lesions. BUS/vagina normal, no abnormal vaginal discharge.  Cervix is normal, no lesions or discharge, no cervical motion tenderness.  Pap obtained.  Uterus and adnexa normal, no mass, nontender  Rectal:   Normal sphincter tone, no masses. Heme negative stool  Extremities:   No clubbing, cyanosis or edema  Pulses:   2+ and symmetric all extremities  Skin:   Skin color, texture, turgor normal, no rashes.   Lymph nodes:   Cervical, supraclavicular, axillary nodes normal  Neurologic:   Normal strength, sensation and gait; reflexes 1+ and  symmetric throughout          Psych:   Normal mood, affect, hygiene and grooming.       05/03/2023   10:41 AM 09/23/2022   11:42 AM 08/24/2022    8:38 AM 08/06/2021    1:43 PM 07/22/2020    9:49 AM  Depression screen PHQ 2/9  Decreased Interest 0 0 3 0 0  Down, Depressed, Hopeless 0 0 2 1 0  PHQ - 2 Score 0 0 5 1 0  Altered sleeping 1 1 3 1    Tired, decreased energy 1 1 3 2    Change in appetite 1 0 3 3   Feeling bad or failure about yourself  3 3 3 3    Trouble concentrating 0 3 3 3    Moving slowly or fidgety/restless 0 1 3 0   Suicidal thoughts 0 0 0 0   PHQ-9 Score 6 9 23 13    Difficult doing work/chores Not difficult at all Not difficult at all Very difficult Very difficult        05/03/2023   10:48 AM 09/23/2022   11:44 AM 08/24/2022    8:40 AM  GAD 7 :  Generalized Anxiety Score  Nervous, Anxious, on Edge 0 3 3  Control/stop worrying 0 1 3  Worry too much - different things 0 1 3  Trouble relaxing 0 0 3  Restless 1 0 3  Easily annoyed or irritable 1 0 3  Afraid - awful might happen 0 0 2  Total GAD 7 Score 2 5 20   Anxiety Difficulty Not difficult at all Not difficult at all Very difficult      ASSESSMENT/PLAN:  Pap today Phq-9 (please do the 9, not the 2), and gad-7  WHEN IS APPT WITH UROLOGIST? SHE WAS SUPPOSED TO HAVE MRI 06/2023 (ordered by neuro)--scheduled??  Trazodone  RF needed?  Other meds should be good until Sept.  Last labs 2022--can repeat, esp if any issues/symptoms   Discussed monthly self breast exams and yearly mammograms; at least 30 minutes of aerobic activity at least 5 days/week, weight-bearing exercise at least 2x/week, core strengthening exercises recommended; proper sunscreen use reviewed; healthy diet, including goals of calcium and vitamin D  intake and alcohol recommendations (less than or equal to 1 drink/day) reviewed; regular seatbelt use; changing batteries in smoke detectors.  Immunization recommendations discussed--continue yearly flu shots.   Updated bivalent COVID booster recommended when available (Fall). Colonoscopy recommendations reviewed, age 14.

## 2023-08-29 NOTE — Patient Instructions (Incomplete)
   HEALTH MAINTENANCE RECOMMENDATIONS:  It is recommended that you get at least 30 minutes of aerobic exercise at least 5 days/week (for weight loss, you may need as much as 60-90 minutes). This can be any activity that gets your heart rate up. This can be divided in 10-15 minute intervals if needed, but try and build up your endurance at least once a week.  Weight bearing exercise is also recommended twice weekly.  Eat a healthy diet with lots of vegetables, fruits and fiber.  "Colorful" foods have a lot of vitamins (ie green vegetables, tomatoes, red peppers, etc).  Limit sweet tea, regular sodas and alcoholic beverages, all of which has a lot of calories and sugar.  Up to 1 alcoholic drink daily may be beneficial for women (unless trying to lose weight, watch sugars).  Drink a lot of water.  Calcium recommendations are 1200-1500 mg daily (1500 mg for postmenopausal women or women without ovaries), and vitamin D 1000 IU daily.  This should be obtained from diet and/or supplements (vitamins), and calcium should not be taken all at once, but in divided doses.  Monthly self breast exams and yearly mammograms for women over the age of 109 is recommended.  Sunscreen of at least SPF 30 should be used on all sun-exposed parts of the skin when outside between the hours of 10 am and 4 pm (not just when at beach or pool, but even with exercise, golf, tennis, and yard work!)  Use a sunscreen that says "broad spectrum" so it covers both UVA and UVB rays, and make sure to reapply every 1-2 hours.  Remember to change the batteries in your smoke detectors when changing your clock times in the spring and fall. Carbon monoxide detectors are recommended for your home.  Use your seat belt every time you are in a car, and please drive safely and not be distracted with cell phones and texting while driving.

## 2023-08-30 ENCOUNTER — Encounter: Payer: Self-pay | Admitting: Family Medicine

## 2023-08-30 ENCOUNTER — Other Ambulatory Visit (HOSPITAL_COMMUNITY)
Admission: RE | Admit: 2023-08-30 | Discharge: 2023-08-30 | Disposition: A | Discharge status: Home / Self Care | Source: Ambulatory Visit | Attending: Family Medicine | Admitting: Family Medicine

## 2023-08-30 ENCOUNTER — Ambulatory Visit (INDEPENDENT_AMBULATORY_CARE_PROVIDER_SITE_OTHER): Payer: No Typology Code available for payment source | Admitting: Family Medicine

## 2023-08-30 VITALS — BP 132/78 | HR 79 | Ht 69.0 in | Wt 191.0 lb

## 2023-08-30 DIAGNOSIS — R635 Abnormal weight gain: Secondary | ICD-10-CM

## 2023-08-30 DIAGNOSIS — F909 Attention-deficit hyperactivity disorder, unspecified type: Secondary | ICD-10-CM

## 2023-08-30 DIAGNOSIS — G379 Demyelinating disease of central nervous system, unspecified: Secondary | ICD-10-CM

## 2023-08-30 DIAGNOSIS — F32A Depression, unspecified: Secondary | ICD-10-CM

## 2023-08-30 DIAGNOSIS — Z5181 Encounter for therapeutic drug level monitoring: Secondary | ICD-10-CM

## 2023-08-30 DIAGNOSIS — Z0000 Encounter for general adult medical examination without abnormal findings: Secondary | ICD-10-CM | POA: Diagnosis present

## 2023-08-30 DIAGNOSIS — R350 Frequency of micturition: Secondary | ICD-10-CM

## 2023-08-30 DIAGNOSIS — F419 Anxiety disorder, unspecified: Secondary | ICD-10-CM

## 2023-08-30 LAB — LIPID PANEL

## 2023-08-30 MED ORDER — TOLTERODINE TARTRATE ER 2 MG PO CP24: ORAL_CAPSULE | ORAL | 0 refills | Status: DC

## 2023-08-31 ENCOUNTER — Ambulatory Visit: Payer: Self-pay | Admitting: Family Medicine

## 2023-08-31 LAB — CBC WITH DIFFERENTIAL/PLATELET
Basophils Absolute: 0 x10E3/uL (ref 0.0–0.2)
Basos: 1 %
EOS (ABSOLUTE): 0.3 x10E3/uL (ref 0.0–0.4)
Eos: 4 %
Hematocrit: 39.4 % (ref 34.0–46.6)
Hemoglobin: 12.4 g/dL (ref 11.1–15.9)
Immature Grans (Abs): 0 x10E3/uL (ref 0.0–0.1)
Immature Granulocytes: 0 %
Lymphocytes Absolute: 2.1 x10E3/uL (ref 0.7–3.1)
Lymphs: 37 %
MCH: 29.5 pg (ref 26.6–33.0)
MCHC: 31.5 g/dL (ref 31.5–35.7)
MCV: 94 fL (ref 79–97)
Monocytes Absolute: 0.5 x10E3/uL (ref 0.1–0.9)
Monocytes: 8 %
Neutrophils Absolute: 2.8 x10E3/uL (ref 1.4–7.0)
Neutrophils: 50 %
Platelets: 323 x10E3/uL (ref 150–450)
RBC: 4.21 x10E6/uL (ref 3.77–5.28)
RDW: 12.3 % (ref 11.7–15.4)
WBC: 5.6 x10E3/uL (ref 3.4–10.8)

## 2023-08-31 LAB — LIPID PANEL
Cholesterol, Total: 229 mg/dL — AB (ref 100–199)
HDL: 92 mg/dL (ref >39)
LDL CALC COMMENT:: 2.5 ratio (ref 0.0–4.4)
LDL Chol Calc (NIH): 115 mg/dL — AB (ref 0–99)
Triglycerides: 131 mg/dL (ref 0–149)
VLDL Cholesterol Cal: 22 mg/dL (ref 5–40)

## 2023-08-31 LAB — COMPREHENSIVE METABOLIC PANEL WITH GFR
ALT: 18 IU/L (ref 0–32)
AST: 19 IU/L (ref 0–40)
Albumin: 4.7 g/dL (ref 3.9–4.9)
Alkaline Phosphatase: 61 IU/L (ref 44–121)
BUN/Creatinine Ratio: 11 (ref 9–23)
BUN: 11 mg/dL (ref 6–24)
Bilirubin Total: 0.9 mg/dL (ref 0.0–1.2)
CO2: 18 mmol/L — AB (ref 20–29)
Calcium: 9.7 mg/dL (ref 8.7–10.2)
Chloride: 102 mmol/L (ref 96–106)
Creatinine, Ser: 1.01 mg/dL — AB (ref 0.57–1.00)
Globulin, Total: 2.3 g/dL (ref 1.5–4.5)
Glucose: 85 mg/dL (ref 70–99)
Potassium: 4.1 mmol/L (ref 3.5–5.2)
Sodium: 136 mmol/L (ref 134–144)
Total Protein: 7 g/dL (ref 6.0–8.5)
eGFR: 72 mL/min/1.73 (ref >59)

## 2023-08-31 LAB — TSH: TSH: 2.5 u[IU]/mL (ref 0.450–4.500)

## 2023-09-02 LAB — CYTOLOGY - PAP
Comment: NEGATIVE
Diagnosis: NEGATIVE
High risk HPV: NEGATIVE

## 2023-09-21 ENCOUNTER — Encounter: Payer: Self-pay | Admitting: Family Medicine

## 2023-09-27 ENCOUNTER — Other Ambulatory Visit: Payer: Self-pay | Admitting: Family Medicine

## 2023-09-27 DIAGNOSIS — R350 Frequency of micturition: Secondary | ICD-10-CM

## 2023-09-27 NOTE — Telephone Encounter (Signed)
 Crystal Wilkerson--this was a new start medication. This should have been sent to me. I'd like for you to check with the patient to see how well it is working, and if she has side effects, if she is taking 1 or 2 tablets daily.  If this is working very well at 1 pill daily, with no side effects, she can actually have #90 with 3 refills. If not working well, can increase to 2 pills as we discussed, and if she did that and tolerated it, can change to the 4 mg dose and rx #90.

## 2023-09-28 ENCOUNTER — Other Ambulatory Visit: Payer: Self-pay

## 2023-09-28 DIAGNOSIS — R350 Frequency of micturition: Secondary | ICD-10-CM

## 2023-09-28 MED ORDER — TOLTERODINE TARTRATE ER 2 MG PO CP24
ORAL_CAPSULE | ORAL | 3 refills | Status: AC
Start: 2023-09-28 — End: ?

## 2023-10-12 ENCOUNTER — Encounter: Payer: Self-pay | Admitting: Family Medicine

## 2023-10-23 ENCOUNTER — Other Ambulatory Visit: Payer: Self-pay | Admitting: Family Medicine

## 2023-10-23 DIAGNOSIS — F419 Anxiety disorder, unspecified: Secondary | ICD-10-CM

## 2023-11-09 ENCOUNTER — Encounter: Payer: Self-pay | Admitting: Family Medicine

## 2024-01-03 ENCOUNTER — Ambulatory Visit
Admission: RE | Admit: 2024-01-03 | Discharge: 2024-01-03 | Disposition: A | Discharge status: Home / Self Care | Source: Ambulatory Visit | Attending: Family Medicine | Admitting: Family Medicine

## 2024-01-03 ENCOUNTER — Other Ambulatory Visit: Payer: Self-pay | Admitting: Family Medicine

## 2024-01-03 DIAGNOSIS — N6489 Other specified disorders of breast: Secondary | ICD-10-CM

## 2024-01-03 HISTORY — PX: BREAST BIOPSY: SHX20

## 2024-01-04 LAB — SURGICAL PATHOLOGY

## 2024-02-16 ENCOUNTER — Encounter: Payer: Self-pay | Admitting: Urology

## 2024-09-18 ENCOUNTER — Encounter: Payer: Self-pay | Admitting: Family Medicine
